# Patient Record
Sex: Female | Born: 1970 | Race: Black or African American | Hispanic: No | Marital: Married | Smoking: Never smoker
Health system: Southern US, Community
[De-identification: ages and names within clinical notes are randomized; demographics above are authoritative.]

## PROBLEM LIST (undated history)

## (undated) DIAGNOSIS — J45909 Unspecified asthma, uncomplicated: Secondary | ICD-10-CM

## (undated) DIAGNOSIS — J40 Bronchitis, not specified as acute or chronic: Secondary | ICD-10-CM

## (undated) HISTORY — PX: GANGLION CYST EXCISION: SHX1691

## (undated) HISTORY — PX: BREAST REDUCTION SURGERY: SHX8

---

## 2012-09-19 ENCOUNTER — Encounter (HOSPITAL_BASED_OUTPATIENT_CLINIC_OR_DEPARTMENT_OTHER): Payer: Self-pay | Admitting: Emergency Medicine

## 2012-09-19 ENCOUNTER — Emergency Department (HOSPITAL_BASED_OUTPATIENT_CLINIC_OR_DEPARTMENT_OTHER)
Admission: EM | Admit: 2012-09-19 | Discharge: 2012-09-19 | Disposition: A | Discharge status: Home / Self Care | Payer: Self-pay | Attending: Emergency Medicine | Admitting: Emergency Medicine

## 2012-09-19 DIAGNOSIS — Y929 Unspecified place or not applicable: Secondary | ICD-10-CM | POA: Insufficient documentation

## 2012-09-19 DIAGNOSIS — X58XXXA Exposure to other specified factors, initial encounter: Secondary | ICD-10-CM | POA: Insufficient documentation

## 2012-09-19 DIAGNOSIS — S5412XA Injury of median nerve at forearm level, left arm, initial encounter: Secondary | ICD-10-CM

## 2012-09-19 DIAGNOSIS — M542 Cervicalgia: Secondary | ICD-10-CM | POA: Insufficient documentation

## 2012-09-19 DIAGNOSIS — Z880 Allergy status to penicillin: Secondary | ICD-10-CM | POA: Insufficient documentation

## 2012-09-19 DIAGNOSIS — J45909 Unspecified asthma, uncomplicated: Secondary | ICD-10-CM | POA: Insufficient documentation

## 2012-09-19 DIAGNOSIS — Y939 Activity, unspecified: Secondary | ICD-10-CM | POA: Insufficient documentation

## 2012-09-19 DIAGNOSIS — S5410XA Injury of median nerve at forearm level, unspecified arm, initial encounter: Secondary | ICD-10-CM | POA: Insufficient documentation

## 2012-09-19 HISTORY — DX: Bronchitis, not specified as acute or chronic: J40

## 2012-09-19 HISTORY — DX: Unspecified asthma, uncomplicated: J45.909

## 2012-09-19 HISTORY — DX: Morbid (severe) obesity due to excess calories: E66.01

## 2012-09-19 LAB — CBC WITH DIFFERENTIAL/PLATELET
Basophils Relative: 0 % (ref 0–1)
Eosinophils Absolute: 0.2 10*3/uL (ref 0.0–0.7)
Lymphs Abs: 4.3 10*3/uL — ABNORMAL HIGH (ref 0.7–4.0)
MCH: 27.4 pg (ref 26.0–34.0)
Neutro Abs: 7.4 10*3/uL (ref 1.7–7.7)
Neutrophils Relative %: 58 % (ref 43–77)
Platelets: 332 10*3/uL (ref 150–400)
RBC: 4.13 MIL/uL (ref 3.87–5.11)

## 2012-09-19 LAB — COMPREHENSIVE METABOLIC PANEL
ALT: 18 U/L (ref 0–35)
Albumin: 3.6 g/dL (ref 3.5–5.2)
Alkaline Phosphatase: 66 U/L (ref 39–117)
Glucose, Bld: 104 mg/dL — ABNORMAL HIGH (ref 70–99)
Potassium: 3.7 mEq/L (ref 3.5–5.1)
Sodium: 144 mEq/L (ref 135–145)
Total Protein: 7.6 g/dL (ref 6.0–8.3)

## 2012-09-19 MED ORDER — IBUPROFEN 600 MG PO TABS: 600.0000 mg | ORAL_TABLET | Freq: Four times a day (QID) | ORAL | Status: DC | PRN

## 2012-09-19 NOTE — ED Notes (Signed)
Pt c/o left arm numbness radiating to neck intermittent x 5 days.

## 2012-09-19 NOTE — ED Provider Notes (Signed)
History    CSN: [id number] Arrival date & time 09/19/12  5284  First MD Initiated Contact with Patient 09/19/12 2054     Chief Complaint  Patient presents with  . Numbness   (Consider location/radiation/quality/duration/timing/severity/associated sxs/prior Treatment) HPI Pt states she woke 5 days ago with numbness and pin and needles sensation in L hand radiating up into arm. Numbness confined to 1st-3rd digits and palmar surface of hand. No weakness. No similar symptoms. No head. C/o neck pain but denies trauma or past surgery. No fever chills.  Past Medical History  Diagnosis Date  . Morbid obesity   . Asthma   . Bronchitis    Past Surgical History  Procedure Laterality Date  . Breast reduction surgery    . Ganglion cyst excision     No family history on file. History  Substance Use Topics  . Smoking status: Never Smoker   . Smokeless tobacco: Not on file  . Alcohol Use: No   OB History   Grav Para Term Preterm Abortions TAB SAB Ect Mult Living                 Review of Systems  Constitutional: Negative for fever and chills.  HENT: Positive for neck pain. Negative for neck stiffness.   Respiratory: Negative for shortness of breath.   Cardiovascular: Negative for chest pain.  Gastrointestinal: Negative for nausea, vomiting and abdominal pain.  Musculoskeletal: Negative for myalgias and back pain.  Skin: Negative for rash and wound.  Neurological: Positive for numbness. Negative for dizziness, syncope, weakness and light-headedness.  All other systems reviewed and are negative.    Allergies  Penicillins  Home Medications   Current Outpatient Rx  Name  Route  Sig  Dispense  Refill  . ibuprofen (ADVIL,MOTRIN) 600 MG tablet   Oral   Take 1 tablet (600 mg total) by mouth every 6 (six) hours as needed for pain.   30 tablet   0    BP 131/90  Pulse 86  Temp(Src) 98.2 F (36.8 C) (Oral)  Resp 18  Ht 5\' 2"  (1.575 m)  Wt 260 lb (117.935 kg)  BMI 47.54  kg/m2  SpO2 99% Physical Exam  Nursing note and vitals reviewed. Constitutional: She is oriented to person, place, and time. She appears well-developed and well-nourished. No distress.  HENT:  Head: Normocephalic and atraumatic.  Mouth/Throat: Oropharynx is clear and moist.  Eyes: EOM are normal. Pupils are equal, round, and reactive to light.  Neck: Normal range of motion. Neck supple.  Mild l cervical paraspinal TTP. No midline tenderness  Cardiovascular: Normal rate and regular rhythm.   Pulmonary/Chest: Effort normal and breath sounds normal. No respiratory distress. She has no wheezes. She has no rales.  Abdominal: Soft. Bowel sounds are normal. She exhibits no distension and no mass. There is no tenderness. There is no rebound and no guarding.  Musculoskeletal: Normal range of motion. She exhibits no edema and no tenderness.  FROM in all joints. +Tinel in L wrist.   Neurological: She is alert and oriented to person, place, and time.  5/5 motor in all ext including intrinsic muscles of hand. Decreased sensation in median distribution of L hand. No other sensory deficits.   Skin: Skin is warm and dry. No rash noted. No erythema.  Psychiatric: She has a normal mood and affect. Her behavior is normal.    ED Course  Procedures (including critical care time) Labs Reviewed  CBC WITH DIFFERENTIAL - Abnormal; Notable for  the following:    WBC 12.8 (*)    Hemoglobin 11.3 (*)    HCT 34.2 (*)    Lymphs Abs 4.3 (*)    All other components within normal limits  COMPREHENSIVE METABOLIC PANEL - Abnormal; Notable for the following:    Glucose, Bld 104 (*)    GFR calc non Af Amer 90 (*)    All other components within normal limits   No results found. 1. Neuropraxia of left median nerve     MDM  Normal electrolytes. elevated WBC of unclear significance. Pt with neuropraxia vs carpal tunnel. Splinted and given hand f/u. Return precautions given.   Loren Racer, MD 09/19/12 [phone]

## 2015-01-12 ENCOUNTER — Encounter (HOSPITAL_BASED_OUTPATIENT_CLINIC_OR_DEPARTMENT_OTHER): Payer: Self-pay | Admitting: Emergency Medicine

## 2015-01-12 ENCOUNTER — Emergency Department (HOSPITAL_BASED_OUTPATIENT_CLINIC_OR_DEPARTMENT_OTHER): Payer: Self-pay

## 2015-01-12 ENCOUNTER — Emergency Department (HOSPITAL_BASED_OUTPATIENT_CLINIC_OR_DEPARTMENT_OTHER)
Admission: EM | Admit: 2015-01-12 | Discharge: 2015-01-12 | Disposition: A | Discharge status: Home / Self Care | Payer: Self-pay | Attending: Emergency Medicine | Admitting: Emergency Medicine

## 2015-01-12 DIAGNOSIS — J40 Bronchitis, not specified as acute or chronic: Secondary | ICD-10-CM

## 2015-01-12 DIAGNOSIS — J45909 Unspecified asthma, uncomplicated: Secondary | ICD-10-CM | POA: Insufficient documentation

## 2015-01-12 DIAGNOSIS — Z880 Allergy status to penicillin: Secondary | ICD-10-CM | POA: Insufficient documentation

## 2015-01-12 MED ORDER — GUAIFENESIN-CODEINE 100-10 MG/5ML PO SOLN
5.0000 mL | Freq: Once | ORAL | Status: AC
  Administered 2015-01-12: 5 mL via ORAL
  Filled 2015-01-12: qty 5

## 2015-01-12 MED ORDER — AEROCHAMBER PLUS FLO-VU MEDIUM MISC
1.0000 | Freq: Once | Status: AC
  Administered 2015-01-12: 1
  Filled 2015-01-12: qty 1

## 2015-01-12 MED ORDER — IPRATROPIUM-ALBUTEROL 0.5-2.5 (3) MG/3ML IN SOLN
3.0000 mL | Freq: Once | RESPIRATORY_TRACT | Status: AC
  Administered 2015-01-12: 3 mL via RESPIRATORY_TRACT
  Filled 2015-01-12: qty 3

## 2015-01-12 MED ORDER — ACETAMINOPHEN-CODEINE 120-12 MG/5ML PO SOLN: 10.0000 mL | ORAL | Status: DC | PRN

## 2015-01-12 MED ORDER — ALBUTEROL SULFATE HFA 108 (90 BASE) MCG/ACT IN AERS
2.0000 | INHALATION_SPRAY | RESPIRATORY_TRACT | Status: DC | PRN
  Administered 2015-01-12: 2 via RESPIRATORY_TRACT
  Filled 2015-01-12: qty 6.7

## 2015-01-12 MED ORDER — PREDNISONE 50 MG PO TABS: 50.0000 mg | ORAL_TABLET | Freq: Every day | ORAL | Status: DC

## 2015-01-12 NOTE — ED Provider Notes (Signed)
CSN: [id number]     Arrival date & time 01/12/15  1906 History   First MD Initiated Contact with Patient 01/12/15 2041     Chief Complaint  Patient presents with  . Cough     (Consider location/radiation/quality/duration/timing/severity/associated sxs/prior Treatment) HPI Patient presents to the emergency department with cough that started 2 months ago, but got worse over the last week.  The patient states that nothing seems make her condition better or worse.  Patient states that she has had to use inhalers in the past due to asthma.  She states that she did not take any medications prior to arrival.  States he has not had any chest pain, nausea, vomiting, weakness, dizziness, headache, blurred vision, fever, runny nose, sore throat, back pain, neck pain, abdominal pain, dysuria, or syncope.  The patient states that she has not used an inhaler in quite a while Past Medical History  Diagnosis Date  . Morbid obesity ([place])   . Asthma   . Bronchitis    Past Surgical History  Procedure Laterality Date  . Breast reduction surgery    . Ganglion cyst excision     History reviewed. No pertinent family history. Social History  Substance Use Topics  . Smoking status: Never Smoker   . Smokeless tobacco: None  . Alcohol Use: No   OB History    No data available     Review of Systems   All other systems negative except as documented in the HPI. All pertinent positives and negatives as reviewed in the HPI. Allergies  Penicillins  Home Medications   Prior to Admission medications   Medication Sig Start Date End Date Taking? Authorizing Provider  acetaminophen-codeine 120-12 MG/5ML solution Take 10 mLs by mouth every 4 (four) hours as needed for moderate pain. 01/12/15   Dalia Heading, PA-C  ibuprofen (ADVIL,MOTRIN) 600 MG tablet Take 1 tablet (600 mg total) by mouth every 6 (six) hours as needed for pain. 09/19/12   Julianne Rice, MD  predniSONE (DELTASONE) 50 MG tablet Take 1  tablet (50 mg total) by mouth daily. 01/12/15   Finneus Kaneshiro, PA-C   BP 136/80 mmHg  Pulse 87  Temp(Src) 98.3 F (36.8 C) (Oral)  Resp 20  Ht 5\' 2"  (1.575 m)  Wt 260 lb (117.935 kg)  BMI 47.54 kg/m2  SpO2 100%  LMP 01/05/2015 Physical Exam  Constitutional: She is oriented to person, place, and time. She appears well-developed and well-nourished. No distress.  HENT:  Head: Normocephalic and atraumatic.  Mouth/Throat: Oropharynx is clear and moist.  Eyes: Pupils are equal, round, and reactive to light.  Neck: Normal range of motion. Neck supple.  Cardiovascular: Normal rate, regular rhythm and normal heart sounds.  Exam reveals no gallop and no friction rub.   No murmur heard. Pulmonary/Chest: Effort normal and breath sounds normal. No respiratory distress. She has no wheezes.  Musculoskeletal: She exhibits no edema.  Neurological: She is alert and oriented to person, place, and time. She exhibits normal muscle tone. Coordination normal.  Skin: Skin is warm and dry. No rash noted. No erythema.  Psychiatric: She has a normal mood and affect. Her behavior is normal.  Nursing note and vitals reviewed.   ED Course  Procedures (including critical care time) Labs Review Labs Reviewed - No data to display  Imaging Review Dg Chest 2 View  01/12/2015  CLINICAL DATA:  Initial evaluation for acute cough for 2 months. EXAM: CHEST  2 VIEW COMPARISON:  None. FINDINGS: Mild cardiomegaly  with probable left atrial enlargement as seen as a double curvilinear density along the right heart border on frontal projection. Mediastinal silhouette within normal limits. The lungs are normally inflated. No airspace consolidation, pleural effusion, or pulmonary edema is identified. There is no pneumothorax. No acute osseous abnormality identified. Mild degenerate spurring within the visualized spine. IMPRESSION: 1. No active cardiopulmonary disease. 2. Mild cardiomegaly. Electronically Signed   By:  Jeannine Boga M.D.   On: 01/12/2015 20:03   I have personally reviewed and evaluated these images and lab results as part of my medical decision-making.   Patient be treated for bronchitis.  Told to return here as needed.  Patient agrees the plan and all questions were answered.  X-rays did not show any signs of pneumonia   Dalia Heading, PA-C 30/94/07 [id number]  David Glick, MD 81/10/31 5945

## 2015-01-12 NOTE — ED Notes (Signed)
PA student at bedside.

## 2015-01-12 NOTE — Discharge Instructions (Signed)
Return here as needed.  Follow up with a primary care doctor °

## 2015-01-12 NOTE — ED Notes (Signed)
Patient states that she is unable to stop coughing. The patient reports that she is having coughing worse at night and it has been intermittent x 2 months

## 2018-08-31 ENCOUNTER — Emergency Department (HOSPITAL_BASED_OUTPATIENT_CLINIC_OR_DEPARTMENT_OTHER): Payer: BLUE CROSS/BLUE SHIELD

## 2018-08-31 ENCOUNTER — Encounter (HOSPITAL_BASED_OUTPATIENT_CLINIC_OR_DEPARTMENT_OTHER): Payer: Self-pay | Admitting: *Deleted

## 2018-08-31 ENCOUNTER — Other Ambulatory Visit: Payer: Self-pay

## 2018-08-31 ENCOUNTER — Emergency Department (HOSPITAL_BASED_OUTPATIENT_CLINIC_OR_DEPARTMENT_OTHER)
Admission: EM | Admit: 2018-08-31 | Discharge: 2018-08-31 | Disposition: A | Discharge status: Home / Self Care | Payer: BLUE CROSS/BLUE SHIELD | Attending: Emergency Medicine | Admitting: Emergency Medicine

## 2018-08-31 DIAGNOSIS — Y929 Unspecified place or not applicable: Secondary | ICD-10-CM | POA: Diagnosis not present

## 2018-08-31 DIAGNOSIS — J45909 Unspecified asthma, uncomplicated: Secondary | ICD-10-CM | POA: Insufficient documentation

## 2018-08-31 DIAGNOSIS — W1842XA Slipping, tripping and stumbling without falling due to stepping into hole or opening, initial encounter: Secondary | ICD-10-CM | POA: Diagnosis not present

## 2018-08-31 DIAGNOSIS — Y998 Other external cause status: Secondary | ICD-10-CM | POA: Insufficient documentation

## 2018-08-31 DIAGNOSIS — Y9389 Activity, other specified: Secondary | ICD-10-CM | POA: Diagnosis not present

## 2018-08-31 DIAGNOSIS — S93402A Sprain of unspecified ligament of left ankle, initial encounter: Secondary | ICD-10-CM | POA: Insufficient documentation

## 2018-08-31 DIAGNOSIS — S99912A Unspecified injury of left ankle, initial encounter: Secondary | ICD-10-CM | POA: Diagnosis present

## 2018-08-31 NOTE — ED Triage Notes (Signed)
Pt seen by PMD today for dx ankle sprain, Naproxen prescription not filled, Post op boot on

## 2018-08-31 NOTE — ED Provider Notes (Signed)
Emergency Department Provider Note   I have reviewed the triage vital signs and the nursing notes.   HISTORY  Chief Complaint Ankle Injury   HPI Christine Spencer is a 48 y.o. female with PMH of asthma presents to the emergency department for evaluation of left ankle pain.  Patient states that she was stepping off a curb and did not see that there was a pothole there.  Her ankle rolled she felt severe pain in the ankle.  She went to work and experiencing swelling and was unable to bear weight.  She went to see her PCP who applied a boot and scheduled an x-ray for Monday.  She was discharged home with a prescription for naproxen.  She came to the emergency department with continued pain.  Denies any numbness.  She does have some radiation of pain into the foot and lower leg.    Past Medical History:  Diagnosis Date  . Asthma   . Bronchitis   . Morbid obesity ([place])     There are no active problems to display for this patient.   Past Surgical History:  Procedure Laterality Date  . BREAST REDUCTION SURGERY    . GANGLION CYST EXCISION      Allergies Penicillins  History reviewed. No pertinent family history.  Social History Social History   Tobacco Use  . Smoking status: Never Smoker  . Smokeless tobacco: Never Used  Substance Use Topics  . Alcohol use: No  . Drug use: No    Review of Systems  Constitutional: No fever/chills Genitourinary: Negative for dysuria. Musculoskeletal: Positive left ankle pain.  Skin: Negative for rash. Neurological: Negative for headaches, focal weakness or numbness.  10-point ROS otherwise negative.  ____________________________________________   PHYSICAL EXAM:  VITAL SIGNS: ED Triage Vitals  Enc Vitals Group     BP 08/31/18 1842 (!) 158/97     Pulse Rate 08/31/18 1842 71     Resp 08/31/18 1842 18     Temp 08/31/18 1842 98.8 F (37.1 C)     Temp Source 08/31/18 1842 Oral     SpO2 08/31/18 1842 100 %     Weight 08/31/18  1843 260 lb (117.9 kg)     Height 08/31/18 1843 5\' 2"  (1.575 m)     Pain Score 08/31/18 1842 10   Constitutional: Alert and oriented. Well appearing and in no acute distress. Eyes: Conjunctivae are normal.  Head: Atraumatic. Nose: No congestion/rhinnorhea. Mouth/Throat: Mucous membranes are moist.  Neck: No stridor.  Cardiovascular: Good peripheral circulation.  Respiratory: Normal respiratory effort.  Gastrointestinal: No distention.  Musculoskeletal: Left lateral ankle tenderness with focal swelling. No tenderness over the foot. No proximal fibular tenderness.  Neurologic:  Normal speech and language. No gross focal neurologic deficits are appreciated.  Skin:  Skin is warm, dry and intact. No rash noted.  ____________________________________________  RADIOLOGY  Ankle x-ray reviewed.  ____________________________________________   PROCEDURES  Procedure(s) performed:   Procedures  None ____________________________________________   INITIAL IMPRESSION / ASSESSMENT AND PLAN / ED COURSE  Pertinent labs & imaging results that were available during my care of the patient were reviewed by me and considered in my medical decision making (see chart for details).   Patient presents to the emergency department for evaluation of left ankle pain after injury.  Suspect sprain clinically.  Will obtain x-ray to rule out fracture.  Not clinically dislocated.  No focal tenderness in the foot.  No proximal fibular tenderness.   Plain film reviewed. No  fracture. Provided crutches and advised RICE at home and sport med/PCP follow up if symptoms persist.  ____________________________________________  FINAL CLINICAL IMPRESSION(S) / ED DIAGNOSES  Final diagnoses:  Sprain of left ankle, unspecified ligament, initial encounter    Note:  This document was prepared using Dragon voice recognition software and may include unintentional dictation errors.  Nanda Quinton, MD Emergency Medicine     , Wonda Olds, MD 09/03/18 [phone]

## 2018-08-31 NOTE — ED Notes (Signed)
ED Provider at bedside. 

## 2018-08-31 NOTE — Discharge Instructions (Signed)
As we discussed, you do not have any broken or dislocated bones in your foot or ankle, but you do have an ankle sprain. There is always a chance that a small fracture did not appear on today's x-ray. Please read through the included information about routine injury care (RICE = rest, ice, compression, elevation), and take over-the-counter pain medicine according to label instructions.  If you do not have any reason to avoid ibuprofen, you can also consider taking ibuprofen 600 mg 3 times a day with meals, but do this for no more than 5 days as it may cause to some stomach discomfort over time.  Use crutches if provided and you may bear weight as tolerated.  Follow-up is recommended with the orthopedic surgeon or with your regular doctor. ° ° °Ankle Sprain °An ankle sprain is an injury to the strong, fibrous tissues (ligaments) that hold the bones of your ankle joint together.  °CAUSES °An ankle sprain is usually caused by a fall or by twisting your ankle. Ankle sprains most commonly occur when you step on the outer edge of your foot, and your ankle turns inward. People who participate in sports are more prone to these types of injuries.  °SYMPTOMS  °Pain in your ankle. The pain may be present at rest or only when you are trying to stand or walk. °Swelling. °Bruising. Bruising may develop immediately or within 1 to 2 days after your injury. °Difficulty standing or walking, particularly when turning corners or changing directions. °DIAGNOSIS  °Your caregiver will ask you details about your injury and perform a physical exam of your ankle to determine if you have an ankle sprain. During the physical exam, your caregiver will press on and apply pressure to specific areas of your foot and ankle. Your caregiver will try to move your ankle in certain ways. An X-ray exam may be done to be sure a bone was not broken or a ligament did not separate from one of the bones in your ankle (avulsion fracture).  °TREATMENT  °Certain  types of braces can help stabilize your ankle. Your caregiver can make a recommendation for this. Your caregiver may recommend the use of medicine for pain. If your sprain is severe, your caregiver may refer you to a surgeon who helps to restore function to parts of your skeletal system (orthopedist) or a physical therapist. °HOME CARE INSTRUCTIONS  °Apply ice to your injury for 1-2 days or as directed by your caregiver. Applying ice helps to reduce inflammation and pain. °Put ice in a plastic bag. °Place a towel between your skin and the bag. °Leave the ice on for 15-20 minutes at a time, every 2 hours while you are awake. °Only take over-the-counter or prescription medicines for pain, discomfort, or fever as directed by your caregiver. °Elevate your injured ankle above the level of your heart as much as possible for 2-3 days. °If your caregiver recommends crutches, use them as instructed. Gradually put weight on the affected ankle. Continue to use crutches or a cane until you can walk without feeling pain in your ankle. °If you have a plaster splint, wear the splint as directed by your caregiver. Do not rest it on anything harder than a pillow for the first 24 hours. Do not put weight on it. Do not get it wet. You may take it off to take a shower or bath. °You may have been given an elastic bandage to wear around your ankle to provide support. If the elastic bandage   is too tight (you have numbness or tingling in your foot or your foot becomes cold and blue), adjust the bandage to make it comfortable. °If you have an air splint, you may blow more air into it or let air out to make it more comfortable. You may take your splint off at night and before taking a shower or bath. Wiggle your toes in the splint several times per day to decrease swelling. °SEEK MEDICAL CARE IF:  °You have rapidly increasing bruising or swelling. °Your toes feel extremely cold or you lose feeling in your foot. °Your pain is not relieved  with medicine. °SEEK IMMEDIATE MEDICAL CARE IF: °Your toes are numb or blue. °You have severe pain that is increasing. °MAKE SURE YOU:  °Understand these instructions. °Will watch your condition. °Will get help right away if you are not doing well or get worse. °  °This information is not intended to replace advice given to you by your health care provider. Make sure you discuss any questions you have with your health care provider. °  °Document Released: 03/14/2005 Document Revised: 04/04/2014 Document Reviewed: 03/26/2011 °Elsevier Interactive Patient Education ©2016 [employer] ° °Elastic Bandage and RICE °WHAT DOES AN ELASTIC BANDAGE DO? °Elastic bandages come in different shapes and sizes. They generally provide support to your injury and reduce swelling while you are healing, but they can perform different functions. Your health care provider will help you to decide what is best for your protection, recovery, or rehabilitation following an injury. °WHAT ARE SOME GENERAL TIPS FOR USING AN ELASTIC BANDAGE? °Use the bandage as directed by the maker of the bandage that you are using. °Do not wrap the bandage too tightly. This may cut off the circulation in the arm or leg in the area below the bandage. °If part of your body beyond the bandage becomes blue, numb, cold, swollen, or is more painful, your bandage is most likely too tight. If this occurs, remove your bandage and reapply it more loosely. °See your health care provider if the bandage seems to be making your problems worse rather than better. °An elastic bandage should be removed and reapplied every 3-4 hours or as directed by your health care provider. °WHAT IS RICE? °The routine care of many injuries includes rest, ice, compression, and elevation (RICE therapy).  °Rest °Rest is required to allow your body to heal. Generally, you can resume your routine activities when you are comfortable and have been given permission by your health care  provider. °Ice °Icing your injury helps to keep the swelling down and it reduces pain. Do not apply ice directly to your skin. °Put ice in a plastic bag. °Place a towel between your skin and the bag. °Leave the ice on for 20 minutes, 2-3 times per day. °Do this for as Shalom Ware as you are directed by your health care provider. °Compression °Compression helps to keep swelling down, gives support, and helps with discomfort. Compression may be done with an elastic bandage. °Elevation °Elevation helps to reduce swelling and it decreases pain. If possible, your injured area should be placed at or above the level of your heart or the center of your chest. °WHEN SHOULD I SEEK MEDICAL CARE? °You should seek medical care if: °You have persistent pain and swelling. °Your symptoms are getting worse rather than improving. °These symptoms may indicate that further evaluation or further X-rays are needed. Sometimes, X-rays may not show a small broken bone (fracture) until a number of days later. Make   a follow-up appointment with your health care provider. Ask when your X-ray results will be ready. Make sure that you get your X-ray results. °WHEN SHOULD I SEEK IMMEDIATE MEDICAL CARE? °You should seek immediate medical care if: °You have a sudden onset of severe pain at or below the area of your injury. °You develop redness or increased swelling around your injury. °You have tingling or numbness at or below the area of your injury that does not improve after you remove the elastic bandage. °  °This information is not intended to replace advice given to you by your health care provider. Make sure you discuss any questions you have with your health care provider. °  °Document Released: 09/03/2001 Document Revised: 12/03/2014 Document Reviewed: 10/28/2013 °Elsevier Interactive Patient Education ©2016 [employer] ° °  °

## 2018-09-19 ENCOUNTER — Ambulatory Visit: Payer: BLUE CROSS/BLUE SHIELD | Admitting: Family Medicine

## 2019-05-01 ENCOUNTER — Other Ambulatory Visit: Payer: Self-pay

## 2019-05-01 ENCOUNTER — Inpatient Hospital Stay (HOSPITAL_COMMUNITY)
Admission: EM | Admit: 2019-05-01 | Discharge: 2019-05-04 | DRG: 811 | Disposition: A | Discharge status: Home / Self Care | Payer: BLUE CROSS/BLUE SHIELD | Attending: Family Medicine | Admitting: Family Medicine

## 2019-05-01 ENCOUNTER — Emergency Department (HOSPITAL_COMMUNITY): Payer: BLUE CROSS/BLUE SHIELD

## 2019-05-01 ENCOUNTER — Encounter (HOSPITAL_COMMUNITY): Payer: Self-pay | Admitting: Family Medicine

## 2019-05-01 DIAGNOSIS — D473 Essential (hemorrhagic) thrombocythemia: Secondary | ICD-10-CM | POA: Diagnosis present

## 2019-05-01 DIAGNOSIS — R0602 Shortness of breath: Secondary | ICD-10-CM | POA: Diagnosis present

## 2019-05-01 DIAGNOSIS — Z880 Allergy status to penicillin: Secondary | ICD-10-CM

## 2019-05-01 DIAGNOSIS — E876 Hypokalemia: Secondary | ICD-10-CM | POA: Diagnosis present

## 2019-05-01 DIAGNOSIS — D649 Anemia, unspecified: Secondary | ICD-10-CM

## 2019-05-01 DIAGNOSIS — I82441 Acute embolism and thrombosis of right tibial vein: Secondary | ICD-10-CM | POA: Diagnosis present

## 2019-05-01 DIAGNOSIS — J4520 Mild intermittent asthma, uncomplicated: Secondary | ICD-10-CM | POA: Diagnosis present

## 2019-05-01 DIAGNOSIS — N920 Excessive and frequent menstruation with regular cycle: Secondary | ICD-10-CM | POA: Diagnosis present

## 2019-05-01 DIAGNOSIS — I82452 Acute embolism and thrombosis of left peroneal vein: Secondary | ICD-10-CM | POA: Diagnosis present

## 2019-05-01 DIAGNOSIS — U071 COVID-19: Secondary | ICD-10-CM | POA: Diagnosis present

## 2019-05-01 DIAGNOSIS — Z6841 Body Mass Index (BMI) 40.0 and over, adult: Secondary | ICD-10-CM | POA: Diagnosis not present

## 2019-05-01 DIAGNOSIS — D62 Acute posthemorrhagic anemia: Principal | ICD-10-CM | POA: Diagnosis present

## 2019-05-01 DIAGNOSIS — R7989 Other specified abnormal findings of blood chemistry: Secondary | ICD-10-CM | POA: Diagnosis not present

## 2019-05-01 DIAGNOSIS — J45909 Unspecified asthma, uncomplicated: Secondary | ICD-10-CM | POA: Diagnosis present

## 2019-05-01 LAB — COMPREHENSIVE METABOLIC PANEL
ALT: 15 U/L (ref 0–44)
AST: 21 U/L (ref 15–41)
Albumin: 3.8 g/dL (ref 3.5–5.0)
Alkaline Phosphatase: 44 U/L (ref 38–126)
Anion gap: 11 (ref 5–15)
BUN: 12 mg/dL (ref 6–20)
CO2: 25 mmol/L (ref 22–32)
Calcium: 10.6 mg/dL — ABNORMAL HIGH (ref 8.9–10.3)
Chloride: 106 mmol/L (ref 98–111)
Creatinine, Ser: 0.77 mg/dL (ref 0.44–1.00)
GFR calc Af Amer: >60 mL/min (ref >60)
GFR calc non Af Amer: >60 mL/min (ref >60)
Glucose, Bld: 112 mg/dL — ABNORMAL HIGH (ref 70–99)
Potassium: 3.1 mmol/L — ABNORMAL LOW (ref 3.5–5.1)
Sodium: 142 mmol/L (ref 135–145)
Total Bilirubin: 1.3 mg/dL — ABNORMAL HIGH (ref 0.3–1.2)
Total Protein: 8.4 g/dL — ABNORMAL HIGH (ref 6.5–8.1)

## 2019-05-01 LAB — CBC WITH DIFFERENTIAL/PLATELET
Abs Immature Granulocytes: 0.04 10*3/uL (ref 0.00–0.07)
Basophils Absolute: 0.1 10*3/uL (ref 0.0–0.1)
Basophils Relative: 1 %
Eosinophils Absolute: 0.1 10*3/uL (ref 0.0–0.5)
Eosinophils Relative: 1 %
HCT: 28.2 % — ABNORMAL LOW (ref 36.0–46.0)
Hemoglobin: 7.5 g/dL — ABNORMAL LOW (ref 12.0–15.0)
Immature Granulocytes: 0 %
Lymphocytes Relative: 25 %
Lymphs Abs: 2.6 10*3/uL (ref 0.7–4.0)
MCH: 18.2 pg — ABNORMAL LOW (ref 26.0–34.0)
MCHC: 26.6 g/dL — ABNORMAL LOW (ref 30.0–36.0)
MCV: 68.3 fL — ABNORMAL LOW (ref 80.0–100.0)
Monocytes Absolute: 1 10*3/uL (ref 0.1–1.0)
Monocytes Relative: 9 %
Neutro Abs: 6.7 10*3/uL (ref 1.7–7.7)
Neutrophils Relative %: 64 %
Platelets: 602 10*3/uL — ABNORMAL HIGH (ref 150–400)
RBC: 4.13 MIL/uL (ref 3.87–5.11)
RDW: 20.6 % — ABNORMAL HIGH (ref 11.5–15.5)
WBC: 10.5 10*3/uL (ref 4.0–10.5)
nRBC: 0 % (ref 0.0–0.2)

## 2019-05-01 LAB — PREPARE RBC (CROSSMATCH)

## 2019-05-01 LAB — I-STAT BETA HCG BLOOD, ED (MC, WL, AP ONLY): I-stat hCG, quantitative: <5 m[IU]/mL (ref <5)

## 2019-05-01 LAB — RETICULOCYTES
Immature Retic Fract: 28.1 % — ABNORMAL HIGH (ref 2.3–15.9)
RBC.: 3.8 MIL/uL — ABNORMAL LOW (ref 3.87–5.11)
Retic Count, Absolute: 80.2 10*3/uL (ref 19.0–186.0)
Retic Ct Pct: 2.1 % (ref 0.4–3.1)

## 2019-05-01 LAB — D-DIMER, QUANTITATIVE: D-Dimer, Quant: 2.55 ug/mL-FEU — ABNORMAL HIGH (ref 0.00–0.50)

## 2019-05-01 LAB — IRON AND TIBC
Iron: 18 ug/dL — ABNORMAL LOW (ref 28–170)
Saturation Ratios: 5 % — ABNORMAL LOW (ref 10.4–31.8)
TIBC: 363 ug/dL (ref 250–450)
UIBC: 345 ug/dL

## 2019-05-01 LAB — PROTIME-INR
INR: 1.1 (ref 0.8–1.2)
Prothrombin Time: 13.9 seconds (ref 11.4–15.2)

## 2019-05-01 LAB — TRIGLYCERIDES: Triglycerides: 105 mg/dL (ref <150)

## 2019-05-01 LAB — ABO/RH: ABO/RH(D): O POS

## 2019-05-01 LAB — MAGNESIUM: Magnesium: 2.5 mg/dL — ABNORMAL HIGH (ref 1.7–2.4)

## 2019-05-01 LAB — BRAIN NATRIURETIC PEPTIDE: B Natriuretic Peptide: 31.4 pg/mL (ref 0.0–100.0)

## 2019-05-01 LAB — C-REACTIVE PROTEIN: CRP: 1.1 mg/dL — ABNORMAL HIGH (ref <1.0)

## 2019-05-01 LAB — FIBRINOGEN: Fibrinogen: 508 mg/dL — ABNORMAL HIGH (ref 210–475)

## 2019-05-01 LAB — LACTIC ACID, PLASMA
Lactic Acid, Venous: 1.6 mmol/L (ref 0.5–1.9)
Lactic Acid, Venous: 1.3 mmol/L (ref 0.5–1.9)

## 2019-05-01 LAB — POC OCCULT BLOOD, ED: Fecal Occult Bld: NEGATIVE

## 2019-05-01 LAB — TROPONIN I (HIGH SENSITIVITY)
Troponin I (High Sensitivity): <2 ng/L (ref <18)
Troponin I (High Sensitivity): <2 ng/L (ref <18)

## 2019-05-01 LAB — FERRITIN: Ferritin: 8 ng/mL — ABNORMAL LOW (ref 11–307)

## 2019-05-01 LAB — LACTATE DEHYDROGENASE: LDH: 216 U/L — ABNORMAL HIGH (ref 98–192)

## 2019-05-01 LAB — POC SARS CORONAVIRUS 2 AG -  ED: SARS Coronavirus 2 Ag: NEGATIVE

## 2019-05-01 LAB — PROCALCITONIN: Procalcitonin: <0.1 ng/mL

## 2019-05-01 MED ORDER — ACETAMINOPHEN 650 MG RE SUPP: 650.0000 mg | Freq: Four times a day (QID) | RECTAL | Status: DC | PRN

## 2019-05-01 MED ORDER — SODIUM CHLORIDE 0.9 % IV SOLN: 10.0000 mL/h | Freq: Once | INTRAVENOUS | Status: DC

## 2019-05-01 MED ORDER — ACETAMINOPHEN 325 MG PO TABS: 650.0000 mg | ORAL_TABLET | Freq: Four times a day (QID) | ORAL | Status: DC | PRN

## 2019-05-01 MED ORDER — ALBUTEROL SULFATE HFA 108 (90 BASE) MCG/ACT IN AERS
2.0000 | INHALATION_SPRAY | Freq: Once | RESPIRATORY_TRACT | Status: AC
  Administered 2019-05-01: 15:00:00 2 via RESPIRATORY_TRACT
  Filled 2019-05-01: qty 6.7

## 2019-05-01 MED ORDER — ALBUTEROL SULFATE HFA 108 (90 BASE) MCG/ACT IN AERS
1.0000 | INHALATION_SPRAY | RESPIRATORY_TRACT | Status: DC | PRN
  Filled 2019-05-01: qty 6.7

## 2019-05-01 MED ORDER — ACETAMINOPHEN 325 MG PO TABS
650.0000 mg | ORAL_TABLET | Freq: Once | ORAL | Status: AC
  Administered 2019-05-01: 15:00:00 650 mg via ORAL
  Filled 2019-05-01: qty 2

## 2019-05-01 MED ORDER — SODIUM CHLORIDE 0.9% FLUSH
3.0000 mL | Freq: Two times a day (BID) | INTRAVENOUS | Status: DC
  Administered 2019-05-02 – 2019-05-04 (×3): 3 mL via INTRAVENOUS

## 2019-05-01 MED ORDER — POTASSIUM CHLORIDE CRYS ER 20 MEQ PO TBCR
40.0000 meq | EXTENDED_RELEASE_TABLET | Freq: Once | ORAL | Status: AC
  Administered 2019-05-01: 40 meq via ORAL
  Filled 2019-05-01: qty 2

## 2019-05-01 NOTE — ED Provider Notes (Signed)
[facility] DEPT Provider Note   CSN: [id number] Arrival date & time: 05/01/19  1418     History Chief Complaint  Patient presents with  . COVID Positive  . SHOB    Christine Spencer is a 49 y.o. female.  HPI      49 year old female presents with shortness of breath.  Patient states that she was diagnosed with COVID-19 approximately 2 weeks ago.  She states that she has had worsening shortness of breath and chest pain since then.  She states the chest pain is related to when she coughs.  She notes it is midsternal, nonradiating and sharp with coughing.  She denies any fevers, chills, nausea, vomiting, diarrhea.  Past Medical History:  Diagnosis Date  . Asthma   . Bronchitis   . Morbid obesity ([place])     There are no problems to display for this patient.   Past Surgical History:  Procedure Laterality Date  . BREAST REDUCTION SURGERY    . GANGLION CYST EXCISION       OB History   No obstetric history on file.     History reviewed. No pertinent family history.  Social History   Tobacco Use  . Smoking status: Never Smoker  . Smokeless tobacco: Never Used  Substance Use Topics  . Alcohol use: No  . Drug use: No    Home Medications Prior to Admission medications   Medication Sig Start Date End Date Taking? Authorizing Provider  acetaminophen (TYLENOL) 325 MG tablet Take 650 mg by mouth every 6 (six) hours as needed for headache.   Yes [provider]  albuterol (VENTOLIN HFA) 108 (90 Base) MCG/ACT inhaler Inhale 2 puffs into the lungs every 4 (four) hours as needed for wheezing. 04/30/16  Yes [provider]  aspirin-acetaminophen-caffeine (EXCEDRIN MIGRAINE) [phone] MG tablet Take 1 tablet by mouth every 6 (six) hours as needed for headache.   Yes [provider]  Multiple Vitamin (MULTIVITAMIN) tablet Take 1 tablet by mouth daily.   Yes [provider]  ibuprofen (ADVIL,MOTRIN) 600 MG tablet Take 1  tablet (600 mg total) by mouth every 6 (six) hours as needed for pain. Patient not taking: Reported on 05/01/2019 09/19/12   Julianne Rice, MD    Allergies    Penicillins  Review of Systems   Review of Systems  Constitutional: Negative for chills and fever.  Respiratory: Positive for cough and shortness of breath.   Cardiovascular: Positive for chest pain.  Gastrointestinal: Negative for abdominal pain, nausea and vomiting.    Physical Exam Updated Vital Signs BP 135/80   Pulse (!) 101   Temp 98.9 F (37.2 C) (Oral)   Resp 14   Ht 5\' 1"  (1.549 m)   Wt 113.4 kg   SpO2 100%   BMI 47.24 kg/m   Physical Exam Vitals and nursing note reviewed.  Constitutional:      Appearance: She is well-developed.  HENT:     Head: Normocephalic and atraumatic.  Eyes:     Conjunctiva/sclera: Conjunctivae normal.  Cardiovascular:     Rate and Rhythm: Normal rate and regular rhythm.     Heart sounds: Normal heart sounds. No murmur.  Pulmonary:     Effort: Pulmonary effort is normal. Tachypnea present. No accessory muscle usage or respiratory distress.     Breath sounds: Examination of the right-lower field reveals rales. Examination of the left-lower field reveals rales. Rales present. No wheezing.  Abdominal:     General: Bowel sounds are  normal. There is no distension.     Palpations: Abdomen is soft.     Tenderness: There is no abdominal tenderness.  Musculoskeletal:        General: No tenderness or deformity. Normal range of motion.     Cervical back: Neck supple.  Skin:    General: Skin is warm and dry.     Findings: No erythema or rash.  Neurological:     Mental Status: She is alert and oriented to person, place, and time.  Psychiatric:        Behavior: Behavior normal.     ED Results / Procedures / Treatments   Labs (all labs ordered are listed, but only abnormal results are displayed) Labs Reviewed  CBC WITH DIFFERENTIAL/PLATELET - Abnormal; Notable for the following  components:      Result Value   Hemoglobin 7.5 (*)    HCT 28.2 (*)    MCV 68.3 (*)    MCH 18.2 (*)    MCHC 26.6 (*)    RDW 20.6 (*)    Platelets 602 (*)    All other components within normal limits  COMPREHENSIVE METABOLIC PANEL - Abnormal; Notable for the following components:   Potassium 3.1 (*)    Glucose, Bld 112 (*)    Calcium 10.6 (*)    Total Protein 8.4 (*)    Total Bilirubin 1.3 (*)    All other components within normal limits  D-DIMER, QUANTITATIVE (NOT AT [facility]) - Abnormal; Notable for the following components:   D-Dimer, Quant 2.55 (*)    All other components within normal limits  LACTATE DEHYDROGENASE - Abnormal; Notable for the following components:   LDH 216 (*)    All other components within normal limits  FERRITIN - Abnormal; Notable for the following components:   Ferritin 8 (*)    All other components within normal limits  FIBRINOGEN - Abnormal; Notable for the following components:   Fibrinogen 508 (*)    All other components within normal limits  C-REACTIVE PROTEIN - Abnormal; Notable for the following components:   CRP 1.1 (*)    All other components within normal limits  SARS CORONAVIRUS 2 (TAT 6-24 HRS)  LACTIC ACID, PLASMA  LACTIC ACID, PLASMA  PROCALCITONIN  TRIGLYCERIDES  BRAIN NATRIURETIC PEPTIDE  I-STAT BETA HCG BLOOD, ED ([facility], [facility], [facility] ONLY)  POC SARS CORONAVIRUS 2 AG -  ED  POC OCCULT BLOOD, ED  TYPE AND SCREEN  ABO/RH  PREPARE RBC (CROSSMATCH)  TROPONIN I (HIGH SENSITIVITY)  TROPONIN I (HIGH SENSITIVITY)    EKG None  Radiology DG Chest Port 1 View  Result Date: 05/01/2019 CLINICAL DATA:  Chest pain, COVID-19 positive test 2 weeks ago, short of breath and cough EXAM: PORTABLE CHEST 1 VIEW COMPARISON:  04/30/2016 FINDINGS: Single frontal view of the chest demonstrates decreased lung volumes. There is accentuation of the central pulmonary vasculature and mild diffuse interstitial prominence, nonspecific. No airspace disease, effusion, or  pneumothorax. IMPRESSION: 1. Low lung volumes with crowding of the central vasculature. 2. No acute airspace disease. Electronically Signed   By: Randa Ngo M.D.   On: 05/01/2019 16:22    Procedures Procedures (including critical care time)  Medications Ordered in ED Medications  0.9 %  sodium chloride infusion (has no administration in time range)  albuterol (VENTOLIN HFA) 108 (90 Base) MCG/ACT inhaler 2 puff (2 puffs Inhalation Given 05/01/19 1529)  acetaminophen (TYLENOL) tablet 650 mg (650 mg Oral Given 05/01/19 1529)    ED Course  I have reviewed the triage vital signs and the nursing notes.  Pertinent labs & imaging results that were available during my care of the patient were reviewed by me and considered in my medical decision making (see chart for details).    MDM Rules/Calculators/A&P                      Patient presents with shortness of breath.  On my initial evaluation patient was tachypneic and unable to speak in full sentences.  She had rales in bilateral bases.  Vital signs show tachycardia in the low 100s and tachypnea, otherwise stable.  Patient afebrile.  Blood work notable for hemoglobin of 7.5.  Potassium little low at 3.1.  Markers for COVID-19 are elevated including fibrinogen, LDH, CRP and D-dimer. Her rapid COVID-19 swab was negative however will obtain repeat swab.  Chest x-ray shows no pneumonia, pneumothorax or pleural effusions.  She was given Tylenol and albuterol with some improvement in her tachypnea.  Patient does note significant vaginal bleeding with her menstruation.  She notes she just got off of her menstruation last week.  She denies any other source of bleeding.  She denies any melena.  She is Hemoccult negative.  Discussed with hospitalist who is agreeable with transfusion of unit packed red blood cells.  Hospitalist agreeable with admission.   Christine Spencer was evaluated in Emergency Department on 05/01/2019 for the symptoms described in the  history of present illness. She was evaluated in the context of the global COVID-19 pandemic, which necessitated consideration that the patient might be at risk for infection with the SARS-CoV-2 virus that causes COVID-19. Institutional protocols and algorithms that pertain to the evaluation of patients at risk for COVID-19 are in a state of rapid change based on information released by regulatory bodies including the CDC and federal and state organizations. These policies and algorithms were followed during the patient's care in the ED.    Final Clinical Impression(s) / ED Diagnoses Final diagnoses:  Shortness of breath  Symptomatic anemia    Rx / DC Orders ED Discharge Orders    None       Rachel Moulds 05/01/19 2046    Davonna Belling, MD 05/03/19 1553

## 2019-05-01 NOTE — H&P (Signed)
History and Physical    PLEASE NOTE THAT DRAGON DICT[employer] SOFTWARE WAS USED IN THE CONSTRUCTION OF THIS NOTE.   Christine Spencer [id number] DOB: March 04, 1971 DOA: 05/01/2019  PCP: Patient, No Pcp Per Patient coming from: home   I have personally briefly reviewed patient's old medical records in [place]  Chief Complaint: Shortness of breath  HPI: Christine Spencer is a 49 y.o. female with medical history significant for chronic iron deficiency anemia, menorrhagia, mild intermittent asthma, who is admitted to [facility] on 05/01/2019 with acute on chronic iron deficiency anemia after presenting from home to [facility] Emergency Department complaining of shortness of breath.   In the setting of a known COVID-19 positive exposure 2 weeks ago at her place of employment, the patient reports that she underwent COVID-19 at that time at [facility] clinic, which she reports was found to be positive.  At the time of this +19 test, the patient reports that she was initially completely asymptomatic, before subsequently developing nonproductive cough and shortness of breath approximately 10 days ago.  Then, approximately 1 week ago, the patient reports that she began to experience her baseline menorrhagia, which lasted until around 04/29/2019.  During this time of vaginal bleeding, the patient reports worsening of her shortness of breath, which has not significantly improved following completion of vaginal bleeding, prompting the patient to present to [facility] emergency department this evening for further evaluation.  The patient denies any recent subjective fever, chills, rigors, or generalized myalgias. Denies any recent headache, neck stiffness, rhinitis, rhinorrhea, sore throat, nausea, vomiting, abdominal pain, diarrhea, or rash. Denies dysuria, gross hematuria, or change in urinary urgency/frequency.  Denies any associated chest pain, palpitations, diaphoresis, or wheezing.   The  patient conveys a history of chronic iron deficiency anemia in the setting of her report of several years of menorrhagia.  In this setting, the patient reports that she was previously on oral iron supplementation, but has never previously required blood product transfusion.  Denies any recent significant worsening of her menorrhagia relative to baseline.  Denies any recent melena, hematochezia, hematemesis, or gross hematuria.  Not on any antiplatelet or anticoagulant medications at home.  The patient reports that she has not recently taken any of her prn Excedrin Migraine, which includes 250 mg of aspirin.     ED Course:  Vital signs in the ED were notable for the following: Temperature max 99.7; heart rate 94-1 05; blood pressure 111/81-130 1/86; respiratory rate 19-28; oxygen saturation 97 to 99% on room air  Labs were notable for the following: CMP notable for the following: Sodium 142, potassium 3.1, bicarbonate 25, creatinine 0.77.  BNP 31.  High-sensitivity troponin I x2 were both found to be less than 2.  Quantitative hCG was found to be less than 5.0.  CBC notable for white blood cell count of 10,500, hemoglobin 7.5, which was relative to most recent prior hemoglobin data point of 11.3 in June 2014, platelets 602.  MCV noted to be 68, MCHC 26.6, and RDW 20.6.  Hemoccult stool was found to be negative in the ED.  Procalcitonin less than 0.10.  Lactic acid 1.6.  In the setting of patient's report of recent COVID-19 test, inflammatory markers were obtained, notable for the following: LDH 216, CRP 1.1, D-dimer 2.55, and fibrinogen 508.  As there is no confirmation of patient's COVID-19 test result from 2 weeks ago available in care everywhere, and the patient is currently unable to provide a hardcopy or electronic  copy confirmation of this positive test finding, rapid Covid antigen was performed in the ED this evening evaluate negative.  Subsequently, nasopharyngeal COVID-19 PCR was obtained, with  result currently pending.  Chest x-ray showed no evidence of acute cardiopulmonary process.   While in the ED, the following were administered: Tylenol 650 mg p.o. x1, albuterol inhaler x1, transfusion of 1 unit PRBC was initiated.    Review of Systems: As per HPI otherwise 10 point review of systems negative.   Past Medical History:  Diagnosis Date  . Asthma   . Bronchitis   . Morbid obesity ([place])     Past Surgical History:  Procedure Laterality Date  . BREAST REDUCTION SURGERY    . GANGLION CYST EXCISION      Social History:  reports that she has never smoked. She has never used smokeless tobacco. She reports that she does not drink alcohol or use drugs.   Allergies  Allergen Reactions  . Penicillins Swelling    History reviewed. No pertinent family history.    Prior to Admission medications   Medication Sig Start Date End Date Taking? Authorizing Provider  acetaminophen (TYLENOL) 325 MG tablet Take 650 mg by mouth every 6 (six) hours as needed for headache.   Yes [provider]  albuterol (VENTOLIN HFA) 108 (90 Base) MCG/ACT inhaler Inhale 2 puffs into the lungs every 4 (four) hours as needed for wheezing. 04/30/16  Yes [provider]  aspirin-acetaminophen-caffeine (EXCEDRIN MIGRAINE) [phone] MG tablet Take 1 tablet by mouth every 6 (six) hours as needed for headache.   Yes [provider]  Multiple Vitamin (MULTIVITAMIN) tablet Take 1 tablet by mouth daily.   Yes [provider]  ibuprofen (ADVIL,MOTRIN) 600 MG tablet Take 1 tablet (600 mg total) by mouth every 6 (six) hours as needed for pain. Patient not taking: Reported on 05/01/2019 09/19/12   Julianne Rice, MD     Objective    Physical Exam: Vitals:   05/01/19 1700 05/01/19 1702 05/01/19 1715 05/01/19 1730  BP: 134/87 134/87  135/80  Pulse: 96 99 (!) 101 (!) 101  Resp: (!) 22  (!) 21 14  Temp:      TempSrc:      SpO2: 100% 100% 100% 100%  Weight:       Height:        General: appears to be stated age; alert, oriented Skin: warm, dry, no rash Head:  AT/[place] Eyes:  PEARL b/l, EOMI Mouth:  Oral mucosa membranes appear dry, normal dentition Neck: supple; trachea midline Heart:  RRR; did not appreciate any M/R/G Lungs: CTAB, did not appreciate any wheezes, rales, or rhonchi Abdomen: + BS; soft, ND, NT Vascular: 2+ pedal pulses b/l; 2+ radial pulses b/l Extremities: no peripheral edema, no muscle wasting   Labs on Admission: I have personally reviewed following labs and imaging studies  CBC: Recent Labs  Lab 05/01/19 1500  WBC 10.5  NEUTROABS 6.7  HGB 7.5*  HCT 28.2*  MCV 68.3*  PLT [id number]*   Basic Metabolic Panel: Recent Labs  Lab 05/01/19 1500  NA 142  K 3.1*  CL 106  CO2 25  GLUCOSE 112*  BUN 12  CREATININE 0.77  CALCIUM 10.6*   GFR: Estimated Creatinine Clearance: 99.4 mL/min (by C-G formula based on SCr of 0.77 mg/dL). Liver Function Tests: Recent Labs  Lab 05/01/19 1500  AST 21  ALT 15  ALKPHOS 44  BILITOT 1.3*  PROT 8.4*  ALBUMIN 3.8   No results for  input(s): LIPASE, AMYLASE in the last 168 hours. No results for input(s): AMMONIA in the last 168 hours. Coagulation Profile: No results for input(s): INR, PROTIME in the last 168 hours. Cardiac Enzymes: No results for input(s): CKTOTAL, CKMB, CKMBINDEX, TROPONINI in the last 168 hours. BNP (last 3 results) No results for input(s): PROBNP in the last 8760 hours. HbA1C: No results for input(s): HGBA1C in the last 72 hours. CBG: No results for input(s): GLUCAP in the last 168 hours. Lipid Profile: Recent Labs    05/01/19 1500  TRIG 105   Thyroid Function Tests: No results for input(s): TSH, T4TOTAL, FREET4, T3FREE, THYROIDAB in the last 72 hours. Anemia Panel: Recent Labs    05/01/19 1500  FERRITIN 8*   Urine analysis: No results found for: COLORURINE, APPEARANCEUR, LABSPEC, PHURINE, GLUCOSEU, HGBUR, BILIRUBINUR, KETONESUR, PROTEINUR,  UROBILINOGEN, NITRITE, LEUKOCYTESUR  Radiological Exams on Admission: DG Chest Port 1 View  Result Date: 05/01/2019 CLINICAL DATA:  Chest pain, COVID-19 positive test 2 weeks ago, short of breath and cough EXAM: PORTABLE CHEST 1 VIEW COMPARISON:  04/30/2016 FINDINGS: Single frontal view of the chest demonstrates decreased lung volumes. There is accentuation of the central pulmonary vasculature and mild diffuse interstitial prominence, nonspecific. No airspace disease, effusion, or pneumothorax. IMPRESSION: 1. Low lung volumes with crowding of the central vasculature. 2. No acute airspace disease. Electronically Signed   By: Randa Ngo M.D.   On: 05/01/2019 16:22    Assessment/Plan   Yicel Sherlin is a 49 y.o. female with medical history significant for chronic iron deficiency anemia, menorrhagia, mild intermittent asthma, who is admitted to [facility] on 05/01/2019 with acute on chronic iron deficiency anemia after presenting from home to [facility] Emergency Department complaining of shortness of breath.    Principal Problem:   Acute on chronic anemia Active Problems:   SOB (shortness of breath)   COVID-19 virus infection   Hypokalemia   Asthma   #) Acute on chronic iron deficiency anemia: In the context of the patient's report of chronic iron deficiency anemia for which the patient reports that she was previously on oral iron supplementation, she presents this evening with hemoglobin of 7.5 relative to most recent prior value of 11.3 when checked in June 2014.  Patient's hemoglobin is 7.5 is found to be microcytic, hypochromic, and associated with an increased RDW, all of which appear to be consistent with iron deficiency anemia, which appears to be on the basis of the patient's report of monthly menorrhagia, as further described above.  Otherwise, no evidence of active versus recent bleed, including Hemoccult stool performed in the ED and found to be negative.  The  patient is normotensive, but mildly tachycardic.  Additionally, I feel that her acute exacerbation of chronic anemia can be considered symptomatic in nature given her report of worsening of shortness of breath following the onset of menorrhagia last week.  Consequently, in the setting of suspected symptomatic anemia, transfusion of 1 unit PRBC was initiated in the ED this evening.  We will also add on iron studies to labs collected prior to initiation of blood transfusion in order to determine if the patient would benefit from any IV iron supplementation.  The patient politely requests assistance and establishing with a new OB/GYN in order to manage her menorrhagia, which she reports has been occurring for several years now.   Plan: Continue transfusion of 1 unit PRBC.  Will repeat hemoglobin level 30 minutes following conclusion of this transfusion.  Repeat CBC  in the morning.  I have also ordered a repeat hemoglobin value to be checked at 10 AM on 05/02/2019.  Monitor on telemetry.  Add on iron studies to specimen collected prior to initiation of blood transfusion.  Add on reticulocyte count.  Check INR.     #) Suspected COVID-19 infection: The patient reports that she tested positive for COVID-19 approximately 2 weeks ago and subsequently became symptomatic with shortness of breath and nonproductive cough, both of which started to last weeks menorrhagia, causing suspicion that initial shortness of breath was contributed to by a COVID-19 infection.  However, unable to find reported COVID-19 positive test result in care everywhere, and the patient is unable to do produce patient at this test finding.  Consequently, COVID-19 PCR result has been checked in the ED this evening, and result is currently pending.  Presentation is not associate with any acute hypoxic respiratory distress at this time.  However, if the patient does test positive for COVID-19, would consider her symptomatic on the basis of the above  rationale, and would consider initiation of remdesivir on this premise.  Of note, presenting chest x-ray shows no evidence of acute cardiopulmonary process, while presenting general inflammatory markers noted to be elevated, as above.   Plan: We will follow for result of nasopharyngeal COVID-19 PCR obtained in the ED this evening, with consideration for initiation of remdesivir's at this does not be positive.  Monitor continuous pulse oximetry.  Monitor on telemetry.  Repeat general inflammatory markers in the morning.  Check ABG.  For now we will continue airborne and contact precautions.     #) Hypokalemia: Presenting labs reflect serum potassium of 3.1.  Plan: Potassium chloride 40 mEq p.o. x1 now.  Add on serum magnesium level.  Repeat BMP in the morning.  Monitor on telemetry.      #) Mild intermittent asthma: Respiratory regimen consists of as needed albuterol.  No evidence of acute asthma exacerbation at this time.  Plan: As needed albuterol inhaler.  In the setting of suspected COVID-19 infection, as above, will monitor on continuous pulse oximetry.    DVT prophylaxis: SCDs Code Status: Full code Family Communication: None Disposition Plan: Per Rounding Team Consults called: None Admission status: Inpatient; med telemetry   PLEASE NOTE THAT DRAGON DICTATION SOFTWARE WAS USED IN THE CONSTRUCTION OF THIS NOTE.   [place] [facility] Pager [phone] From [place].   Otherwise, please contact night-coverage  [email or web address] Password [facility]  05/01/2019, 6:45 PM

## 2019-05-01 NOTE — ED Triage Notes (Signed)
Patient states she was tested positive two weeks today off of [facility]. No record in Epic with [place] was found. She reports she is having shortness of breath and cough. Also, complains of bilateral leg pain.

## 2019-05-02 DIAGNOSIS — U071 COVID-19: Secondary | ICD-10-CM | POA: Diagnosis present

## 2019-05-02 DIAGNOSIS — J45909 Unspecified asthma, uncomplicated: Secondary | ICD-10-CM | POA: Diagnosis present

## 2019-05-02 DIAGNOSIS — E876 Hypokalemia: Secondary | ICD-10-CM | POA: Diagnosis present

## 2019-05-02 DIAGNOSIS — R0602 Shortness of breath: Secondary | ICD-10-CM | POA: Diagnosis present

## 2019-05-02 DIAGNOSIS — D649 Anemia, unspecified: Secondary | ICD-10-CM | POA: Diagnosis present

## 2019-05-02 LAB — PROTIME-INR
INR: 1.2 (ref 0.8–1.2)
Prothrombin Time: 14.9 seconds (ref 11.4–15.2)

## 2019-05-02 LAB — CBC WITH DIFFERENTIAL/PLATELET
Abs Immature Granulocytes: 0.04 10*3/uL (ref 0.00–0.07)
Basophils Absolute: 0.1 10*3/uL (ref 0.0–0.1)
Basophils Relative: 1 %
Eosinophils Absolute: 0.1 10*3/uL (ref 0.0–0.5)
Eosinophils Relative: 1 %
HCT: 29.5 % — ABNORMAL LOW (ref 36.0–46.0)
Hemoglobin: 8.3 g/dL — ABNORMAL LOW (ref 12.0–15.0)
Immature Granulocytes: 0 %
Lymphocytes Relative: 23 %
Lymphs Abs: 2.3 10*3/uL (ref 0.7–4.0)
MCH: 19.7 pg — ABNORMAL LOW (ref 26.0–34.0)
MCHC: 28.1 g/dL — ABNORMAL LOW (ref 30.0–36.0)
MCV: 70.1 fL — ABNORMAL LOW (ref 80.0–100.0)
Monocytes Absolute: 0.9 10*3/uL (ref 0.1–1.0)
Monocytes Relative: 9 %
Neutro Abs: 6.5 10*3/uL (ref 1.7–7.7)
Neutrophils Relative %: 66 %
Platelets: 510 10*3/uL — ABNORMAL HIGH (ref 150–400)
RBC: 4.21 MIL/uL (ref 3.87–5.11)
RDW: 21.4 % — ABNORMAL HIGH (ref 11.5–15.5)
WBC: 10 10*3/uL (ref 4.0–10.5)
nRBC: 0 % (ref 0.0–0.2)

## 2019-05-02 LAB — HEMOGLOBIN AND HEMATOCRIT, BLOOD
HCT: 28.9 % — ABNORMAL LOW (ref 36.0–46.0)
Hemoglobin: 8.2 g/dL — ABNORMAL LOW (ref 12.0–15.0)

## 2019-05-02 LAB — TYPE AND SCREEN
ABO/RH(D): O POS
Antibody Screen: NEGATIVE
Unit division: 0

## 2019-05-02 LAB — COMPREHENSIVE METABOLIC PANEL
ALT: 15 U/L (ref 0–44)
AST: 20 U/L (ref 15–41)
Albumin: 3.5 g/dL (ref 3.5–5.0)
Alkaline Phosphatase: 42 U/L (ref 38–126)
Anion gap: 10 (ref 5–15)
BUN: 14 mg/dL (ref 6–20)
CO2: 26 mmol/L (ref 22–32)
Calcium: 10.5 mg/dL — ABNORMAL HIGH (ref 8.9–10.3)
Chloride: 106 mmol/L (ref 98–111)
Creatinine, Ser: 0.82 mg/dL (ref 0.44–1.00)
GFR calc Af Amer: >60 mL/min (ref >60)
GFR calc non Af Amer: >60 mL/min (ref >60)
Glucose, Bld: 110 mg/dL — ABNORMAL HIGH (ref 70–99)
Potassium: 3.9 mmol/L (ref 3.5–5.1)
Sodium: 142 mmol/L (ref 135–145)
Total Bilirubin: 1.3 mg/dL — ABNORMAL HIGH (ref 0.3–1.2)
Total Protein: 8 g/dL (ref 6.5–8.1)

## 2019-05-02 LAB — SARS CORONAVIRUS 2 (TAT 6-24 HRS): SARS Coronavirus 2: POSITIVE — AB

## 2019-05-02 LAB — ABO/RH: ABO/RH(D): O POS

## 2019-05-02 LAB — BPAM RBC
Blood Product Expiration Date: 202102282359
ISSUE DATE / TIME: 202102031939
Unit Type and Rh: 5100

## 2019-05-02 LAB — FERRITIN: Ferritin: 9 ng/mL — ABNORMAL LOW (ref 11–307)

## 2019-05-02 LAB — D-DIMER, QUANTITATIVE: D-Dimer, Quant: 2.52 ug/mL-FEU — ABNORMAL HIGH (ref 0.00–0.50)

## 2019-05-02 LAB — MAGNESIUM: Magnesium: 2.5 mg/dL — ABNORMAL HIGH (ref 1.7–2.4)

## 2019-05-02 LAB — HIV ANTIBODY (ROUTINE TESTING W REFLEX): HIV Screen 4th Generation wRfx: NONREACTIVE

## 2019-05-02 LAB — LACTATE DEHYDROGENASE: LDH: 215 U/L — ABNORMAL HIGH (ref 98–192)

## 2019-05-02 LAB — C-REACTIVE PROTEIN: CRP: 1 mg/dL — ABNORMAL HIGH (ref <1.0)

## 2019-05-02 LAB — PREPARE RBC (CROSSMATCH)

## 2019-05-02 LAB — PATHOLOGIST SMEAR REVIEW

## 2019-05-02 LAB — FIBRINOGEN: Fibrinogen: 384 mg/dL (ref 210–475)

## 2019-05-02 MED ORDER — SODIUM CHLORIDE 0.9% IV SOLUTION: Freq: Once | INTRAVENOUS | Status: AC

## 2019-05-02 MED ORDER — ZINC SULFATE 220 (50 ZN) MG PO CAPS
220.0000 mg | ORAL_CAPSULE | Freq: Every day | ORAL | Status: DC
  Administered 2019-05-02 – 2019-05-04 (×3): 220 mg via ORAL
  Filled 2019-05-02 (×3): qty 1

## 2019-05-02 MED ORDER — GUAIFENESIN-DM 100-10 MG/5ML PO SYRP
10.0000 mL | ORAL_SOLUTION | ORAL | Status: DC | PRN
  Administered 2019-05-03 – 2019-05-04 (×4): 10 mL via ORAL
  Filled 2019-05-02 (×3): qty 10

## 2019-05-02 MED ORDER — ENOXAPARIN SODIUM 60 MG/0.6ML ~~LOC~~ SOLN
55.0000 mg | SUBCUTANEOUS | Status: DC
  Administered 2019-05-02: 55 mg via SUBCUTANEOUS
  Filled 2019-05-02: qty 0.6

## 2019-05-02 MED ORDER — DEXAMETHASONE 6 MG PO TABS
6.0000 mg | ORAL_TABLET | Freq: Every day | ORAL | Status: DC
  Administered 2019-05-02: 6 mg via ORAL
  Filled 2019-05-02: qty 1

## 2019-05-02 MED ORDER — ADULT MULTIVITAMIN W/MINERALS CH
1.0000 | ORAL_TABLET | Freq: Every day | ORAL | Status: DC
  Administered 2019-05-02 – 2019-05-04 (×3): 1 via ORAL
  Filled 2019-05-02 (×3): qty 1

## 2019-05-02 MED ORDER — SODIUM CHLORIDE 0.9 % IV SOLN
200.0000 mg | Freq: Once | INTRAVENOUS | Status: AC
  Administered 2019-05-02: 200 mg via INTRAVENOUS
  Filled 2019-05-02: qty 200

## 2019-05-02 MED ORDER — HYDROCOD POLST-CPM POLST ER 10-8 MG/5ML PO SUER: 5.0000 mL | Freq: Two times a day (BID) | ORAL | Status: DC | PRN

## 2019-05-02 MED ORDER — SODIUM CHLORIDE 0.9 % IV SOLN
510.0000 mg | Freq: Once | INTRAVENOUS | Status: AC
  Administered 2019-05-02: 510 mg via INTRAVENOUS
  Filled 2019-05-02: qty 17

## 2019-05-02 MED ORDER — SODIUM CHLORIDE 0.9 % IV SOLN
100.0000 mg | Freq: Every day | INTRAVENOUS | Status: DC
  Administered 2019-05-03 – 2019-05-04 (×2): 100 mg via INTRAVENOUS
  Filled 2019-05-02 (×2): qty 20

## 2019-05-02 MED ORDER — FERROUS SULFATE 325 (65 FE) MG PO TABS
325.0000 mg | ORAL_TABLET | Freq: Every day | ORAL | Status: DC
  Administered 2019-05-03 – 2019-05-04 (×2): 325 mg via ORAL
  Filled 2019-05-02 (×2): qty 1

## 2019-05-02 MED ORDER — ASCORBIC ACID 500 MG PO TABS
500.0000 mg | ORAL_TABLET | Freq: Every day | ORAL | Status: DC
  Administered 2019-05-02 – 2019-05-04 (×3): 500 mg via ORAL
  Filled 2019-05-02 (×3): qty 1

## 2019-05-02 NOTE — Progress Notes (Signed)
PROGRESS NOTE  Christine Spencer [id number] DOB: October 24, 1970 DOA: 05/01/2019 PCP: Patient, No Pcp Per   HPI/Recap of past 24 hours: HPI from Dr Velia Meyer Christine Spencer is a 49 y.o. female with medical history significant for chronic iron deficiency anemia, menorrhagia, mild intermittent asthma, presented to the ED complaining of shortness of breath.  Patient reports COVID-19 exposure 2 weeks ago at her place of employment, after which she subsequently tested positive.  Patient was initially asymptomatic, before subsequently developed pain nonproductive cough and shortness of breath for the past 10 days.  Patient also has a history of menorrhagia, of which she reported worsening shortness of breath during her period.  In the ED, patient saturating well on room air, afebrile, mildly tachycardic, with some tachypnea.  Labs show hemoglobin of 7.5 (baseline around 11), pregnancy test negative, Hemoccult stool negative, inflammatory markers mildly elevated.  COVID-19 PCR positive.  Chest x-ray showed no evidence of acute cardiopulmonary process.  Patient received 1 unit of PRBC in the ED.  Patient admitted for further management.     Today, saw patient briefly while [facility] was about to transport patient.  Still reports shortness of breath, but slight improvement.  Denies any chest pain, abdominal pain, nausea/vomiting, fever/chills.    Assessment/Plan: Principal Problem:   Acute on chronic anemia Active Problems:   SOB (shortness of breath)   COVID-19 virus infection   Hypokalemia   Asthma   Anemia   COVID-19 infection Currently afebrile, with no leukocytosis Saturating well on room air, although reports shortness of breath Inflammatory markers slightly elevated, will trend Chest x-ray unremarkable Due to her shortness of breath, tachypnea, history of asthma, remdesivir and Decadron were started, will continue Supplemental oxygen as needed, inhalers, incentive spirometry Vitamins,  cough suppressant  Acute on chronic iron deficiency anemia Presented with a hemoglobin of 7.5, baseline around 11.3 Likely 2/2 menorrhagia, FOBT negative Anemia panel showed iron 18, sats 5, ferritin 9 Status post 1 unit of PRBC Give a dose of Feraheme on 05/02/2019, start daily oral iron supplementation Patient advised to follow-up with an OB/GYN as an outpatient Daily CBC  Mild intermittent asthma Continue as needed albuterol For the management as above  Morbid obesity Lifestyle modification advised          Malnutrition Type:      Malnutrition Characteristics:      Nutrition Interventions:       Estimated body mass index is 48.18 kg/m as calculated from the following:   Height as of this encounter: 5\' 1"  (1.549 m).   Weight as of this encounter: 115.7 kg.     Code Status: Full  Family Communication: None at bedside  Disposition Plan: Plan to DC home once hemoglobin has stabilized, as well as overall clinical improvement.   Consultants:  None  Procedures:  None  Antimicrobials:  None  DVT prophylaxis: SCDs   Objective: Vitals:   05/02/19 0500 05/02/19 0530 05/02/19 0700 05/02/19 0800  BP: 119/76 (!) 138/94 (!) 117/97 (!) 136/92  Pulse: 92 93 92 91  Resp: (!) 35 (!) 28 20 (!) 22  Temp:      TempSrc:      SpO2: 97% 96% 98% 95%  Weight:      Height:        Intake/Output Summary (Last 24 hours) at 05/02/2019 1034 Last data filed at 05/02/2019 0716 Gross per 24 hour  Intake 880 ml  Output --  Net 880 ml   Filed Weights   05/01/19 1443 05/02/19  0116  Weight: 113.4 kg 115.7 kg    Exam:  General: NAD   Cardiovascular: S1, S2 present  Respiratory: CTAB  Abdomen: Soft, nontender, nondistended, bowel sounds present  Musculoskeletal: No bilateral pedal edema noted  Skin: Normal  Psychiatry: Normal mood   Data Reviewed: CBC: Recent Labs  Lab 05/01/19 1500 05/02/19 0316  WBC 10.5 10.0  NEUTROABS 6.7 6.5  HGB 7.5*  8.3*  HCT 28.2* 29.5*  MCV 68.3* 70.1*  PLT 602* 99991111*   Basic Metabolic Panel: Recent Labs  Lab 05/01/19 1500 05/02/19 0316  NA 142 142  K 3.1* 3.9  CL 106 106  CO2 25 26  GLUCOSE 112* 110*  BUN 12 14  CREATININE 0.77 0.82  CALCIUM 10.6* 10.5*  MG 2.5* 2.5*   GFR: Estimated Creatinine Clearance: 98.3 mL/min (by C-G formula based on SCr of 0.82 mg/dL). Liver Function Tests: Recent Labs  Lab 05/01/19 1500 05/02/19 0316  AST 21 20  ALT 15 15  ALKPHOS 44 42  BILITOT 1.3* 1.3*  PROT 8.4* 8.0  ALBUMIN 3.8 3.5   No results for input(s): LIPASE, AMYLASE in the last 168 hours. No results for input(s): AMMONIA in the last 168 hours. Coagulation Profile: Recent Labs  Lab 05/01/19 1500 05/02/19 0316  INR 1.1 1.2   Cardiac Enzymes: No results for input(s): CKTOTAL, CKMB, CKMBINDEX, TROPONINI in the last 168 hours. BNP (last 3 results) No results for input(s): PROBNP in the last 8760 hours. HbA1C: No results for input(s): HGBA1C in the last 72 hours. CBG: No results for input(s): GLUCAP in the last 168 hours. Lipid Profile: Recent Labs    05/01/19 1500  TRIG 105   Thyroid Function Tests: No results for input(s): TSH, T4TOTAL, FREET4, T3FREE, THYROIDAB in the last 72 hours. Anemia Panel: Recent Labs    05/01/19 1500 05/01/19 2058 05/01/19 2102 05/02/19 0316  FERRITIN 8*  --   --  9*  TIBC  --   --  363  --   IRON  --   --  18*  --   RETICCTPCT  --  2.1  --   --    Urine analysis: No results found for: COLORURINE, APPEARANCEUR, LABSPEC, PHURINE, GLUCOSEU, HGBUR, BILIRUBINUR, KETONESUR, PROTEINUR, UROBILINOGEN, NITRITE, LEUKOCYTESUR Sepsis Labs: @LABRCNTIP (procalcitonin:4,lacticidven:4)  ) Recent Results (from the past 240 hour(s))  SARS CORONAVIRUS 2 (TAT 6-24 HRS) Nasopharyngeal Nasopharyngeal Swab     Status: Abnormal   Collection Time: 05/01/19  7:59 PM   Specimen: Nasopharyngeal Swab  Result Value Ref Range Status   SARS Coronavirus 2 POSITIVE (A)  NEGATIVE Final    Comment: RESULT CALLED TO, READ BACK BY AND VERIFIED WITH: RN RYAN CHRISTIAN  AT 0427 BY MESSAN H. ON 05/02/2019 (NOTE) SARS-CoV-2 target nucleic acids are DETECTED. The SARS-CoV-2 RNA is generally detectable in upper and lower respiratory specimens during the acute phase of infection. Positive results are indicative of the presence of SARS-CoV-2 RNA. Clinical correlation with patient history and other diagnostic information is  necessary to determine patient infection status. Positive results do not rule out bacterial infection or co-infection with other viruses.  The expected result is Negative. Fact Sheet for Patients: SugarRoll.be Fact Sheet for Healthcare Providers: https://www.woods-mathews.com/ This test is not yet approved or cleared by the [place] FDA and  has been authorized for detection and/or diagnosis of SARS-CoV-2 by FDA under an Emergency Use Authorization (EUA). This EUA will remain  in effect (meaning this test can  be used) for the duration of the  COVID-19 declaration under Section 564(b)(1) of the Act, 21 U.S.C. section 360bbb-3(b)(1), unless the authorization is terminated or revoked sooner. Performed at [facility], [place] [address] [place], [place] [address]       Studies: DG Chest Port 1 View  Result Date: 05/01/2019 CLINICAL DATA:  Chest pain, COVID-19 positive test 2 weeks ago, short of breath and cough EXAM: PORTABLE CHEST 1 VIEW COMPARISON:  04/30/2016 FINDINGS: Single frontal view of the chest demonstrates decreased lung volumes. There is accentuation of the central pulmonary vasculature and mild diffuse interstitial prominence, nonspecific. No airspace disease, effusion, or pneumothorax. IMPRESSION: 1. Low lung volumes with crowding of the central vasculature. 2. No acute airspace disease. Electronically Signed   By: Randa Ngo M.D.   On: 05/01/2019 16:22    Scheduled Meds: .  vitamin C  500 mg Oral Daily  . dexamethasone  6 mg Oral Daily  . multivitamin with minerals  1 tablet Oral Daily  . sodium chloride flush  3 mL Intravenous Q12H  . zinc sulfate  220 mg Oral Daily    Continuous Infusions: . sodium chloride    . [START ON 05/03/2019] remdesivir 100 mg in NS 100 mL       LOS: 1 day     Alma Friendly, MD [facility]  If 7PM-7AM, please contact night-coverage [email or web address] 05/02/2019, 10:34 AM

## 2019-05-02 NOTE — Progress Notes (Addendum)
Saw patient at [facility] on transfer. She feels less weak with exertion, improved dyspnea since admission but still moderately short of breath worse with exertion. No chest pain, no palpitations. Denies any ongoing bleeding. Her cough is persistent, severe, and associated with increasing dyspnea.   Remains tachycardic and tachypneic but not hypoxemic.   - Stop steroids.  - Continue remdesivir. - Discussed risks/benefits of IV iron with patient who consents, confirmed availability of feraheme with pharmacy will transfuse now.  - Recheck H/H this PM, pt agrees to repeat transfusion if hgb < 8.3g/dl given her persistent tachycardia and still significant departure from baseline hgb/symptomatic anemia.  - Recheck labs in AM. If labs and vital signs are stable, will likely DC 2/5.  Vance Gather, MD 05/02/2019 11:35 AM

## 2019-05-02 NOTE — Plan of Care (Signed)

## 2019-05-02 NOTE — ED Notes (Signed)
[facility] to transport patient to [place].

## 2019-05-03 ENCOUNTER — Inpatient Hospital Stay (HOSPITAL_COMMUNITY): Payer: BLUE CROSS/BLUE SHIELD

## 2019-05-03 DIAGNOSIS — R7989 Other specified abnormal findings of blood chemistry: Secondary | ICD-10-CM

## 2019-05-03 DIAGNOSIS — J4520 Mild intermittent asthma, uncomplicated: Secondary | ICD-10-CM

## 2019-05-03 LAB — CBC WITH DIFFERENTIAL/PLATELET
Abs Immature Granulocytes: 0.03 10*3/uL (ref 0.00–0.07)
Basophils Absolute: 0.1 10*3/uL (ref 0.0–0.1)
Basophils Relative: 1 %
Eosinophils Absolute: 0 10*3/uL (ref 0.0–0.5)
Eosinophils Relative: 0 %
HCT: 30.9 % — ABNORMAL LOW (ref 36.0–46.0)
Hemoglobin: 8.9 g/dL — ABNORMAL LOW (ref 12.0–15.0)
Immature Granulocytes: 0 %
Lymphocytes Relative: 18 %
Lymphs Abs: 2.1 10*3/uL (ref 0.7–4.0)
MCH: 20.9 pg — ABNORMAL LOW (ref 26.0–34.0)
MCHC: 28.8 g/dL — ABNORMAL LOW (ref 30.0–36.0)
MCV: 72.5 fL — ABNORMAL LOW (ref 80.0–100.0)
Monocytes Absolute: 1.1 10*3/uL — ABNORMAL HIGH (ref 0.1–1.0)
Monocytes Relative: 10 %
Neutro Abs: 8.4 10*3/uL — ABNORMAL HIGH (ref 1.7–7.7)
Neutrophils Relative %: 71 %
Platelets: 490 10*3/uL — ABNORMAL HIGH (ref 150–400)
RBC: 4.26 MIL/uL (ref 3.87–5.11)
RDW: 22.3 % — ABNORMAL HIGH (ref 11.5–15.5)
WBC: 11.8 10*3/uL — ABNORMAL HIGH (ref 4.0–10.5)
nRBC: 0 % (ref 0.0–0.2)

## 2019-05-03 LAB — TYPE AND SCREEN
ABO/RH(D): O POS
Antibody Screen: NEGATIVE
Unit division: 0

## 2019-05-03 LAB — APTT: aPTT: 38 seconds — ABNORMAL HIGH (ref 24–36)

## 2019-05-03 LAB — BPAM RBC
Blood Product Expiration Date: 202103082359
ISSUE DATE / TIME: 202102041925
Unit Type and Rh: 5100

## 2019-05-03 LAB — HEPARIN LEVEL (UNFRACTIONATED): Heparin Unfractionated: <0.1 IU/mL — ABNORMAL LOW (ref 0.30–0.70)

## 2019-05-03 LAB — FERRITIN: Ferritin: 19 ng/mL (ref 11–307)

## 2019-05-03 LAB — HEMOGLOBIN AND HEMATOCRIT, BLOOD
HCT: 29.3 % — ABNORMAL LOW (ref 36.0–46.0)
Hemoglobin: 8.5 g/dL — ABNORMAL LOW (ref 12.0–15.0)

## 2019-05-03 LAB — D-DIMER, QUANTITATIVE: D-Dimer, Quant: 2.91 ug/mL-FEU — ABNORMAL HIGH (ref 0.00–0.50)

## 2019-05-03 MED ORDER — HEPARIN (PORCINE) 25000 UT/250ML-% IV SOLN
1300.0000 [IU]/h | INTRAVENOUS | Status: AC
  Administered 2019-05-03: 900 [IU]/h via INTRAVENOUS
  Filled 2019-05-03: qty 250

## 2019-05-03 NOTE — Progress Notes (Signed)
Patient's PRBC was done at 2328 on 05/02/19. Labs were drawn at [facility] on 05/03/19. H/H drawn too early. Put in for a repeat draw.

## 2019-05-03 NOTE — Progress Notes (Signed)
PROGRESS NOTE  Christine Spencer  [id number] DOB: 28-Nov-1970 DOA: 05/01/2019 PCP: Patient, No Pcp Per Brief Narrative: Christine Spencer is a 49 y.o. female with a history of iron deficiency anemia, mild intermittent asthma, and menorrhagia who presented with progressive shortness of breath having recently completed a menstrual cycle. She'd also had a cough and had been found to be positive for covid-19 approximately 2 weeks prior after exposure at work. Evaluation in the ED revealed microcytic anemia with hemoglobin of 7.5g/dl (baseline 11-12) and low ferritin, UPT negative, FOBT negative, SARS-CoV-2 PCR positive, CRP 1.0. CXR without infiltrate and despite significant exertional dyspnea, she was not hypoxemic. She was given 1u PRBCs, remdesivir and transferred to [facility]. Steroids had been given but were stopped due to no hypoxemia. An additional unit of PRBCs and IV iron were given 2/4 and hemoglobin rebounded to 8.9, subsequently down to 8.5g/dl despite no active bleeding reported. Due to continued elevation of d-dimer in light of minimally elevated CRP, LDH, lower extremity venous U/S was performed revealing bilateral acute DVTs per preliminary report. Due need for therapeutic anticoagulation in the face of continued transfusion-dependent anemia, IV heparin is started and CBC will continue to be monitored.   Assessment & Plan: Principal Problem:   Acute on chronic anemia Active Problems:   SOB (shortness of breath)   COVID-19 virus infection   Hypokalemia   Asthma   Anemia  Symptomatic acute blood loss anemia on chronic iron deficiency anemia: Due presumably to menorrhagia per pt history. Has had unrevealing colonoscopy and EGD, negative FOBT in ED, and no other reports of bleeding.  - Continue monitoring serial CBC.  - s/p 2u PRBCs 2/4 with hgb 7.5 >> 8.5.  - Given IV iron 2/4, continue po supplementation as well.  - Will need close PCP follow up with labs within a week of discharge. CM consulted.    Menorrhagia:  - Needs close OB/GYN follow up and strong consideration of prn medication to stop menstruation (e.g. megace).  Covid-19 infection: At high risk of progression and remains tachypneic, dyspneic. SARS-CoV-2 Ag negative, PCR positive 2/3 - Continue remdesivir 2/4 - 2/8, would be candidate for outpatient infusions if able to DC prior to completing course. - Stopped steroids with no hypoxemia or infiltrates.  - Continue isolation x21 days.   Acute bilateral lower extremity DVTs: Based on preliminary U/S 2/5. No clinical evidence of pulmonary embolism, so will forego CTA chest.  - Transition prophylactic lovenox to IV heparin due to need to stop abruptly if bleeding noted.  - Plan to transition to po anticoagulation if CBC stable for 3-6 months.  Mild intermittent asthma: No exacerbation - Continue prn albuterol  Obesity: BMI 44. Noted.  DVT prophylaxis: IV heparin Code Status: Full Family Communication: Mother on speaker phone throughout encounter Disposition Plan: Discharge delayed by need to monitor blood counts and clinically while starting anticoagulation with transfusion-dependent anemia.  Consultants:   None  Procedures:   Lower extremity venous U/S 05/03/2019: Formal result pending.  Antimicrobials:  Remdesivir 2/4 - 2/8   Subjective: Shortness of breath improved, still some cough, no bleeding. Denies chest pain.   Objective: Vitals:   05/02/19 2328 05/03/19 0411 05/03/19 0500 05/03/19 0800  BP: 125/87 124/78  123/84  Pulse: (!) 104 96  83  Resp: (!) 22 (!) 21  (!) 22  Temp: 98.5 F (36.9 C) 98.5 F (36.9 C)  98.2 F (36.8 C)  TempSrc: Oral Oral  Oral  SpO2: 96% 94%  96%  Weight:  107.1 kg   Height:   5\' 1"  (1.549 m)     Intake/Output Summary (Last 24 hours) at 05/03/2019 1135 Last data filed at 05/03/2019 0500 Gross per 24 hour  Intake 1635 ml  Output -  Net 1635 ml   Filed Weights   05/01/19 1443 05/02/19 0116 05/03/19 0500  Weight: 113.4  kg 115.7 kg 107.1 kg    Gen: 49 y.o. female in no distress  Pulm: Non-labored breathing room air. Clear to auscultation bilaterally.  CV: Regular rate and rhythm. No murmur, rub, or gallop. No JVD, no pedal edema. GI: Abdomen soft, non-tender, non-distended, with normoactive bowel sounds. No organomegaly or masses felt. Ext: Warm, no deformities Skin: No rashes, lesions or ulcers Neuro: Alert and oriented. No focal neurological deficits. Psych: Judgement and insight appear normal. Mood & affect appropriate.   Data Reviewed: I have personally reviewed following labs and imaging studies  CBC: Recent Labs  Lab 05/01/19 1500 05/02/19 0316 05/02/19 1415 05/03/19 0029 05/03/19 0444  WBC 10.5 10.0  --  11.8*  --   NEUTROABS 6.7 6.5  --  8.4*  --   HGB 7.5* 8.3* 8.2* 8.9* 8.5*  HCT 28.2* 29.5* 28.9* 30.9* 29.3*  MCV 68.3* 70.1*  --  72.5*  --   PLT 602* 510*  --  490*  --    Basic Metabolic Panel: Recent Labs  Lab 05/01/19 1500 05/02/19 0316  NA 142 142  K 3.1* 3.9  CL 106 106  CO2 25 26  GLUCOSE 112* 110*  BUN 12 14  CREATININE 0.77 0.82  CALCIUM 10.6* 10.5*  MG 2.5* 2.5*   GFR: Estimated Creatinine Clearance: 93.7 mL/min (by C-G formula based on SCr of 0.82 mg/dL). Liver Function Tests: Recent Labs  Lab 05/01/19 1500 05/02/19 0316  AST 21 20  ALT 15 15  ALKPHOS 44 42  BILITOT 1.3* 1.3*  PROT 8.4* 8.0  ALBUMIN 3.8 3.5   No results for input(s): LIPASE, AMYLASE in the last 168 hours. No results for input(s): AMMONIA in the last 168 hours. Coagulation Profile: Recent Labs  Lab 05/01/19 1500 05/02/19 0316  INR 1.1 1.2   Cardiac Enzymes: No results for input(s): CKTOTAL, CKMB, CKMBINDEX, TROPONINI in the last 168 hours. BNP (last 3 results) No results for input(s): PROBNP in the last 8760 hours. HbA1C: No results for input(s): HGBA1C in the last 72 hours. CBG: No results for input(s): GLUCAP in the last 168 hours. Lipid Profile: Recent Labs     05/01/19 1500  TRIG 105   Thyroid Function Tests: No results for input(s): TSH, T4TOTAL, FREET4, T3FREE, THYROIDAB in the last 72 hours. Anemia Panel: Recent Labs    05/01/19 1500 05/01/19 2058 05/01/19 2102 05/02/19 0316 05/03/19 0029  FERRITIN   < >  --   --  9* 19  TIBC  --   --  363  --   --   IRON  --   --  18*  --   --   RETICCTPCT  --  2.1  --   --   --    < > = values in this interval not displayed.   Urine analysis: No results found for: COLORURINE, APPEARANCEUR, LABSPEC, PHURINE, GLUCOSEU, HGBUR, BILIRUBINUR, KETONESUR, PROTEINUR, UROBILINOGEN, NITRITE, LEUKOCYTESUR Recent Results (from the past 240 hour(s))  SARS CORONAVIRUS 2 (TAT 6-24 HRS) Nasopharyngeal Nasopharyngeal Swab     Status: Abnormal   Collection Time: 05/01/19  7:59 PM   Specimen: Nasopharyngeal Swab  Result Value Ref  Range Status   SARS Coronavirus 2 POSITIVE (A) NEGATIVE Final    Comment: RESULT CALLED TO, READ BACK BY AND VERIFIED WITH: RN Teodoro Jeffreys CHRISTIAN  AT 0427 BY MESSAN H. ON 05/02/2019 (NOTE) SARS-CoV-2 target nucleic acids are DETECTED. The SARS-CoV-2 RNA is generally detectable in upper and lower respiratory specimens during the acute phase of infection. Positive results are indicative of the presence of SARS-CoV-2 RNA. Clinical correlation with patient history and other diagnostic information is  necessary to determine patient infection status. Positive results do not rule out bacterial infection or co-infection with other viruses.  The expected result is Negative. Fact Sheet for Patients: SugarRoll.be Fact Sheet for Healthcare Providers: https://www.woods-mathews.com/ This test is not yet approved or cleared by the [place] FDA and  has been authorized for detection and/or diagnosis of SARS-CoV-2 by FDA under an Emergency Use Authorization (EUA). This EUA will remain  in effect (meaning this test can  be used) for the duration of the  COVID-19 declaration under Section 564(b)(1) of the Act, 21 U.S.C. section 360bbb-3(b)(1), unless the authorization is terminated or revoked sooner. Performed at [facility], [place] [address] [place], [place] [address]       Radiology Studies: DG Chest Port 1 View  Result Date: 05/01/2019 CLINICAL DATA:  Chest pain, COVID-19 positive test 2 weeks ago, short of breath and cough EXAM: PORTABLE CHEST 1 VIEW COMPARISON:  04/30/2016 FINDINGS: Single frontal view of the chest demonstrates decreased lung volumes. There is accentuation of the central pulmonary vasculature and mild diffuse interstitial prominence, nonspecific. No airspace disease, effusion, or pneumothorax. IMPRESSION: 1. Low lung volumes with crowding of the central vasculature. 2. No acute airspace disease. Electronically Signed   By: Randa Ngo M.D.   On: 05/01/2019 16:22    Scheduled Meds: . vitamin C  500 mg Oral Daily  . enoxaparin (LOVENOX) injection  55 mg Subcutaneous Q24H  . ferrous sulfate  325 mg Oral Q breakfast  . multivitamin with minerals  1 tablet Oral Daily  . sodium chloride flush  3 mL Intravenous Q12H  . zinc sulfate  220 mg Oral Daily   Continuous Infusions: . sodium chloride    . remdesivir 100 mg in NS 100 mL 100 mg (05/03/19 1055)     LOS: 2 days   Time spent: 35 minutes.  Patrecia Pour, MD [facility] [email or web address] 05/03/2019, 11:35 AM

## 2019-05-03 NOTE — Plan of Care (Signed)

## 2019-05-03 NOTE — Progress Notes (Signed)
Bilateral lower extremity venous duplex has been completed. Preliminary results can be found in CV Proc through chart review.  Results were given to the patient's nurse, Chrissie Noa.  05/03/19 11:21 AM Carlos Levering RVT

## 2019-05-03 NOTE — Progress Notes (Signed)
ANTICOAGULATION CONSULT NOTE - Initial Consult  Pharmacy Consult for Heparin Indication: DVT  Allergies  Allergen Reactions  . Penicillins Swelling    Patient Measurements: Height: 5\' 1"  (154.9 cm) Weight: 236 lb 1.8 oz (107.1 kg)(standing scale) IBW/kg (Calculated) : 47.8 Heparin Dosing Weight: 74 kg  Vital Signs: Temp: 98.2 F (36.8 C) (02/05 0800) Temp Source: Oral (02/05 0800) BP: 123/84 (02/05 0800) Pulse Rate: 83 (02/05 0800)  Labs: Recent Labs    05/01/19 1500 05/01/19 1500 05/01/19 1729 05/02/19 0316 05/02/19 0316 05/02/19 1415 05/02/19 1415 05/03/19 0029 05/03/19 0444  HGB 7.5*   < >  --  8.3*   < > 8.2*   < > 8.9* 8.5*  HCT 28.2*   < >  --  29.5*   < > 28.9*  --  30.9* 29.3*  PLT 602*  --   --  510*  --   --   --  490*  --   LABPROT 13.9  --   --  14.9  --   --   --   --   --   INR 1.1  --   --  1.2  --   --   --   --   --   CREATININE 0.77  --   --  0.82  --   --   --   --   --   TROPONINIHS <2  --  <2.00  --   --   --   --   --   --    < > = values in this interval not displayed.    Estimated Creatinine Clearance: 93.7 mL/min (by C-G formula based on SCr of 0.82 mg/dL).   Medical History: Past Medical History:  Diagnosis Date  . Asthma   . Bronchitis   . Morbid obesity (HCC)     Medications:  Scheduled:  . vitamin C  500 mg Oral Daily  . ferrous sulfate  325 mg Oral Q breakfast  . multivitamin with minerals  1 tablet Oral Daily  . sodium chloride flush  3 mL Intravenous Q12H  . zinc sulfate  220 mg Oral Daily    Assessment: 49 y/o F with a h/o chronic iron deficiency anemia and menorrhagia admitted with anemia and COVID-19 infection. Patient has been transfused and received feraheme. Last dose of Lovenox 2/4. Pharmacy consulted for heparin infusion for new DVT.   Hgb low post-transfusion No active bleeding reported  Goal of Therapy:  Heparin level 0.3-0.5  units/ml Monitor platelets by anticoagulation protocol: Yes   Plan:  Start  heparin infusion at 900 units/hr Check anti-Xa level in 6 hours and daily while on heparin Continue to monitor H&H and platelets  Ulice Dash D 05/03/2019,11:42 AM

## 2019-05-03 NOTE — TOC Transition Note (Signed)
Transition of Care [facility]) - CM/SW Discharge Note   Patient Details  Name: Christine Spencer MRN: [id number] Date of Birth: 09-14-1970  [facility] CM/SW Contact:  Leeroy Cha, RN Phone Number: 05/03/2019, 10:08 AM   Clinical Narrative:    dcd to home  . Orders checked for dme, hhc or cm needs. insturction to make appointment with the health and wellness clinic given.  Final next level of care: Home/Self Care Barriers to Discharge: No Barriers Identified   Patient Goals and CMS Choice Patient states their goals for this hospitalization and ongoing recovery are:: to go home CMS Medicare.gov Compare Post Acute Care list provided to:: Patient    Discharge Placement                       Discharge Plan and Services   Discharge Planning Services: CM Consult, [facility]                                 Social Determinants of Health (SDOH) Interventions     Readmission Risk Interventions No flowsheet data found.

## 2019-05-03 NOTE — Progress Notes (Signed)
ANTICOAGULATION CONSULT NOTE   Pharmacy Consult for Heparin Indication: DVT  Assessment: 49 y/o F with a h/o chronic iron deficiency anemia and menorrhagia admitted with anemia and COVID-19 infection. Patient has been transfused and received feraheme. Last dose of Lovenox 2/4. Pharmacy consulted for heparin infusion for new DVT.   Hgb low post-transfusion No active bleeding reported Initial heparin level <0.1 units/ml  Goal of Therapy:  Heparin level 0.3-0.5  units/ml Monitor platelets by anticoagulation protocol: Yes   Plan:  Increase heparin drip to 1100 units/hr Check heparin level in 6 hours  Christine Spencer 05/03/2019,11:41 PM

## 2019-05-04 LAB — HEPARIN LEVEL (UNFRACTIONATED): Heparin Unfractionated: <0.1 IU/mL — ABNORMAL LOW (ref 0.30–0.70)

## 2019-05-04 LAB — CBC WITH DIFFERENTIAL/PLATELET
Abs Immature Granulocytes: 0.05 10*3/uL (ref 0.00–0.07)
Basophils Absolute: 0.1 10*3/uL (ref 0.0–0.1)
Basophils Relative: 1 %
Eosinophils Absolute: 0.2 10*3/uL (ref 0.0–0.5)
Eosinophils Relative: 1 %
HCT: 32.4 % — ABNORMAL LOW (ref 36.0–46.0)
Hemoglobin: 9.3 g/dL — ABNORMAL LOW (ref 12.0–15.0)
Immature Granulocytes: 0 %
Lymphocytes Relative: 24 %
Lymphs Abs: 2.8 10*3/uL (ref 0.7–4.0)
MCH: 20.8 pg — ABNORMAL LOW (ref 26.0–34.0)
MCHC: 28.7 g/dL — ABNORMAL LOW (ref 30.0–36.0)
MCV: 72.5 fL — ABNORMAL LOW (ref 80.0–100.0)
Monocytes Absolute: 1.1 10*3/uL — ABNORMAL HIGH (ref 0.1–1.0)
Monocytes Relative: 10 %
Neutro Abs: 7.4 10*3/uL (ref 1.7–7.7)
Neutrophils Relative %: 64 %
Platelets: 452 10*3/uL — ABNORMAL HIGH (ref 150–400)
RBC: 4.47 MIL/uL (ref 3.87–5.11)
RDW: 23.1 % — ABNORMAL HIGH (ref 11.5–15.5)
WBC: 11.6 10*3/uL — ABNORMAL HIGH (ref 4.0–10.5)
nRBC: 0.3 % — ABNORMAL HIGH (ref 0.0–0.2)

## 2019-05-04 LAB — FERRITIN: Ferritin: 218 ng/mL (ref 11–307)

## 2019-05-04 LAB — D-DIMER, QUANTITATIVE: D-Dimer, Quant: 2.81 ug/mL-FEU — ABNORMAL HIGH (ref 0.00–0.50)

## 2019-05-04 MED ORDER — APIXABAN 5 MG PO TABS: ORAL_TABLET | ORAL | 1 refills | Status: DC

## 2019-05-04 MED ORDER — APIXABAN 5 MG PO TABS
10.0000 mg | ORAL_TABLET | Freq: Two times a day (BID) | ORAL | Status: DC
  Administered 2019-05-04: 10 mg via ORAL
  Filled 2019-05-04: qty 2

## 2019-05-04 MED ORDER — FERROUS SULFATE 325 (65 FE) MG PO TBEC: 325.0000 mg | DELAYED_RELEASE_TABLET | Freq: Three times a day (TID) | ORAL | 2 refills | Status: AC

## 2019-05-04 MED ORDER — APIXABAN 5 MG PO TABS: 5.0000 mg | ORAL_TABLET | Freq: Two times a day (BID) | ORAL | Status: DC

## 2019-05-04 MED ORDER — FERROUS SULFATE 325 (65 FE) MG PO TBEC: 325.0000 mg | DELAYED_RELEASE_TABLET | Freq: Three times a day (TID) | ORAL | 2 refills | Status: DC

## 2019-05-04 NOTE — Discharge Summary (Signed)
Physician Discharge Summary  Christine Spencer [id number] DOB: 14-Jul-1970 DOA: 05/01/2019  PCP: Patient, No Pcp Per  Admit date: 05/01/2019 Discharge date: 05/04/2019  Admitted From: Home Disposition: Home   Recommendations for Outpatient Follow-up:  1. Follow up with PCP in 1-2 weeks 2. Please obtain CBC within one week 3. Recommend OB/GYN and/or PCP follow up for management of menorrhagia.  Home Health: None Equipment/Devices: None Discharge Condition: Stable CODE STATUS: Full Diet recommendation: As tolerated  Brief/Interim Summary: Christine Spencer is a 49 y.o. female with a history of iron deficiency anemia, mild intermittent asthma, and menorrhagia who presented with progressive shortness of breath having recently completed a menstrual cycle. She'd also had a cough and had been found to be positive for covid-19 approximately 2 weeks prior after exposure at work. Evaluation in the ED revealed microcytic anemia with hemoglobin of 7.5g/dl (baseline 11-12) and low ferritin, UPT negative, FOBT negative, SARS-CoV-2 PCR positive, CRP 1.0. CXR without infiltrate and despite significant exertional dyspnea, she was not hypoxemic. She was given 1u PRBCs, remdesivir and transferred to [facility]. Steroids had been given but were stopped due to no hypoxemia. An additional unit of PRBCs and IV iron were given 2/4 and hemoglobin rebounded to 8.9, subsequently down to 8.5g/dl despite no active bleeding reported. Due to continued elevation of d-dimer in light of minimally elevated CRP, LDH, lower extremity venous U/S was performed revealing bilateral acute DVTs per preliminary report. Due need for therapeutic anticoagulation in the face of continued transfusion-dependent anemia, IV heparin is started and CBC will continue to be monitored.   Discharge Diagnoses:  Principal Problem:   Acute on chronic anemia Active Problems:   SOB (shortness of breath)   COVID-19 virus infection   Hypokalemia   Asthma    Anemia  Symptomatic acute blood loss anemia on chronic iron deficiency anemia: Due presumably to menorrhagia per pt history. Has had unrevealing colonoscopy and EGD, negative FOBT in ED, and no other reports of bleeding. nRBCs initially zero, now beginning to show up on CBC after iron infusion.  - Continue oral iron with bowel regimen - Needs close follow up of CBC whether with PCP or [facility] - s/p 2u PRBCs 2/4 with hgb 7.5 >> 9.3, s/p IV iron 2/4 - Will need close PCP follow up with labs within a week of discharge. CM consulted.   Menorrhagia:  - Needs close OB/GYN follow up and strong consideration of prn medication to stop menstruation (e.g. megace).  Covid-19 infection: At high risk of progression and remains tachypneic, dyspneic. SARS-CoV-2 Ag negative, PCR positive 2/3 - Continue remdesivir 2/4 - 2/8, arranged to complete course as outpatient in infusion clinic on 2/7 and 2/8 at 10am. - Stopped steroids with no hypoxemia or infiltrates.  - Continue isolation x21 days.   Acute bilateral lower extremity DVTs: In the right posterior tibial veins and left peroneal veins by U/S 2/5. No clinical evidence of pulmonary embolism, so will forego CTA chest. No bleeding or hgb decrease after initiating anticoagulation.  - Continue therapeutic anticoagulation x 3-6 months. After discussion of options, patient amenable to eliquis. Insurance is Nurse, mental health.   Mild intermittent asthma: No exacerbation - Continue prn albuterol  Obesity: BMI 44. Noted.  Thrombocytosis: Likely due to iron deficiency anemia and/or reactive, improving.  - Monitor at follow up.   Discharge Instructions Discharge Instructions    Diet general   Complete by: As directed    Discharge instructions   Complete by: As directed  You are being discharged from the hospital after treatment for covid-19 infection. You are felt to be stable enough to no longer require inpatient monitoring, testing, and  treatment, though you will need to follow the recommendations below: - Continue taking iron supplements. These will cause constipation and you should take a laxative to prevent that.  - Return to this hospital tomorrow morning for remdesivir.  - To treat the blood clots in your legs, continue taking eliquis (blood thinner/anticoagulant). You will take 2 tablets twice a day for a week then go down to 1 tablet twice a day thereafter for a total of 3-6 months. You must let your doctor know if you have any significant/uncontrolled bleeding - Call the [facility] on Monday to schedule a hospital follow up appointment with labs (complete blood count). - Per CDC guidelines, you will need to remain in isolation for 21 days from your first positive covid test. - Do not take NSAID medications (including, but not limited to, ibuprofen, advil, motrin, naproxen, aleve, goody's powder, etc.) - Follow up with your doctor in the next week via telehealth or seek medical attention right away if your symptoms get WORSE.  - Consider donating plasma after you have recovered (either 14 days after a negative test or 28 days after symptoms have completely resolved) because your antibodies to this virus may be helpful to give to others with life-threatening infections. Please go to the website www.[email or web address] if you would like to consider volunteering for plasma donation.    Directions for you at home:  Wear a facemask You should wear a facemask that covers your nose and mouth when you are in the same room with other people and when you visit a healthcare provider. People who live with or visit you should also wear a facemask while they are in the same room with you.  Separate yourself from other people in your home As much as possible, you should stay in a different room from other people in your home. Also, you should use a separate bathroom, if available.  Avoid sharing household items You  should not share dishes, drinking glasses, cups, eating utensils, towels, bedding, or other items with other people in your home. After using these items, you should wash them thoroughly with soap and water.  Cover your coughs and sneezes Cover your mouth and nose with a tissue when you cough or sneeze, or you can cough or sneeze into your sleeve. Throw used tissues in a lined trash can, and immediately wash your hands with soap and water for at least 20 seconds or use an alcohol-based hand rub.  Wash your [facility] your hands often and thoroughly with soap and water for at least 20 seconds. You can use an alcohol-based hand sanitizer if soap and water are not available and if your hands are not visibly dirty. Avoid touching your eyes, nose, and mouth with unwashed hands.  Directions for those who live with, or provide care at home for you:  Limit the number of people who have contact with the patient If possible, have only one caregiver for the patient. Other household members should stay in another home or place of residence. If this is not possible, they should stay in another room, or be separated from the patient as much as possible. Use a separate bathroom, if available. Restrict visitors who do not have an essential need to be in the home.  Ensure good ventilation Make sure that shared spaces  in the home have good air flow, such as from an air conditioner or an opened window, weather permitting.  Wash your hands often Wash your hands often and thoroughly with soap and water for at least 20 seconds. You can use an alcohol based hand sanitizer if soap and water are not available and if your hands are not visibly dirty. Avoid touching your eyes, nose, and mouth with unwashed hands. Use disposable paper towels to dry your hands. If not available, use dedicated cloth towels and replace them when they become wet.  Wear a facemask and gloves Wear a disposable facemask at all times in  the room and gloves when you touch or have contact with the patient's blood, body fluids, and/or secretions or excretions, such as sweat, saliva, sputum, nasal mucus, vomit, urine, or feces.  Ensure the mask fits over your nose and mouth tightly, and do not touch it during use. Throw out disposable facemasks and gloves after using them. Do not reuse. Wash your hands immediately after removing your facemask and gloves. If your personal clothing becomes contaminated, carefully remove clothing and launder. Wash your hands after handling contaminated clothing. Place all used disposable facemasks, gloves, and other waste in a lined container before disposing them with other household waste. Remove gloves and wash your hands immediately after handling these items.  Do not share dishes, glasses, or other household items with the patient Avoid sharing household items. You should not share dishes, drinking glasses, cups, eating utensils, towels, bedding, or other items with a patient who is confirmed to have, or being evaluated for, COVID-19 infection. After the person uses these items, you should wash them thoroughly with soap and water.  Wash laundry thoroughly Immediately remove and wash clothes or bedding that have blood, body fluids, and/or secretions or excretions, such as sweat, saliva, sputum, nasal mucus, vomit, urine, or feces, on them. Wear gloves when handling laundry from the patient. Read and follow directions on labels of laundry or clothing items and detergent. In general, wash and dry with the warmest temperatures recommended on the label.  Clean all areas the individual has used often Clean all touchable surfaces, such as counters, tabletops, doorknobs, bathroom fixtures, toilets, phones, keyboards, tablets, and bedside tables, every day. Also, clean any surfaces that may have blood, body fluids, and/or secretions or excretions on them. Wear gloves when cleaning surfaces the patient has  come in contact with. Use a diluted bleach solution (e.g., dilute bleach with 1 part bleach and 10 parts water) or a household disinfectant with a label that says EPA-registered for coronaviruses. To make a bleach solution at home, add 1 tablespoon of bleach to 1 quart (4 cups) of water. For a larger supply, add  cup of bleach to 1 gallon (16 cups) of water. Read labels of cleaning products and follow recommendations provided on product labels. Labels contain instructions for safe and effective use of the cleaning product including precautions you should take when applying the product, such as wearing gloves or eye protection and making sure you have good ventilation during use of the product. Remove gloves and wash hands immediately after cleaning.  Monitor yourself for signs and symptoms of illness Caregivers and household members are considered close contacts, should monitor their health, and will be asked to limit movement outside of the home to the extent possible. Follow the monitoring steps for close contacts listed on the symptom monitoring form.  If you have additional questions, contact your local health department or call  the epidemiologist on call at [phone] (available 24/7). This guidance is subject to change. For the most up-to-date guidance from [facility], please refer to their website: YouBlogs.pl   Increase activity slowly   Complete by: As directed    MyChart COVID-19 home monitoring program   Complete by: May 04, 2019    Is the patient willing to use the [facility] for home monitoring?: Yes     Allergies as of 05/04/2019      Reactions   Penicillins Swelling      Medication List    STOP taking these medications   ibuprofen 600 MG tablet Commonly known as: ADVIL     TAKE these medications   acetaminophen 325 MG tablet Commonly known as: TYLENOL Take 650 mg by mouth every 6 (six) hours as needed  for headache.   albuterol 108 (90 Base) MCG/ACT inhaler Commonly known as: VENTOLIN HFA Inhale 2 puffs into the lungs every 4 (four) hours as needed for wheezing.   apixaban 5 MG Tabs tablet Commonly known as: Eliquis Take 2 tablets (10mg ) twice daily for 7 days, then 1 tablet (5mg ) twice daily   aspirin-acetaminophen-caffeine 250-250-65 MG tablet Commonly known as: EXCEDRIN MIGRAINE Take 1 tablet by mouth every 6 (six) hours as needed for headache.   ferrous sulfate 325 (65 FE) MG EC tablet Take 1 tablet (325 mg total) by mouth 3 (three) times daily with meals. take with stool softener/laxative   multivitamin tablet Take 1 tablet by mouth daily.      Follow-up Information    [facility]. Call.   Why: callk Monday Feb 8 to make a hospital follow appt. Contact information: [place] [phone] [place], [facility]. Call.   Why: Call to arrange a new patient appointment Contact information: [place] [address] [phone]          Allergies  Allergen Reactions  . Penicillins Swelling    Consultations:  Pharmacy  Procedures/Studies: DG Chest Port 1 View  Result Date: 05/01/2019 CLINICAL DATA:  Chest pain, COVID-19 positive test 2 weeks ago, short of breath and cough EXAM: PORTABLE CHEST 1 VIEW COMPARISON:  04/30/2016 FINDINGS: Single frontal view of the chest demonstrates decreased lung volumes. There is accentuation of the central pulmonary vasculature and mild diffuse interstitial prominence, nonspecific. No airspace disease, effusion, or pneumothorax. IMPRESSION: 1. Low lung volumes with crowding of the central vasculature. 2. No acute airspace disease. Electronically Signed   By: Randa Ngo M.D.   On: 05/01/2019 16:22   VAS [place] LOWER EXTREMITY VENOUS (DVT)  Result Date: 05/03/2019  Lower Venous DVTStudy Indications: Elevated  Ddimer.  Risk Factors: COVID 19 positive. Comparison Study: No prior studies. Performing Technologist: Oliver Hum RVT  Examination Guidelines: A complete evaluation includes B-mode imaging, spectral Doppler, color Doppler, and power Doppler as needed of all accessible portions of each vessel. Bilateral testing is considered an integral part of a complete examination. Limited examinations for reoccurring indications may be performed as noted. The reflux portion of the exam is performed with the patient in reverse Trendelenburg.  +---------+---------------+---------+-----------+----------+--------------+ RIGHT    CompressibilityPhasicitySpontaneityPropertiesThrombus Aging +---------+---------------+---------+-----------+----------+--------------+ CFV      Full           Yes      Yes                                 +---------+---------------+---------+-----------+----------+--------------+  SFJ      Full                                                        +---------+---------------+---------+-----------+----------+--------------+ FV Prox  Full                                                        +---------+---------------+---------+-----------+----------+--------------+ FV Mid   Full                                                        +---------+---------------+---------+-----------+----------+--------------+ FV DistalFull                                                        +---------+---------------+---------+-----------+----------+--------------+ PFV      Full                                                        +---------+---------------+---------+-----------+----------+--------------+ POP      Full           Yes      Yes                                 +---------+---------------+---------+-----------+----------+--------------+ PTV      None                                         Acute           +---------+---------------+---------+-----------+----------+--------------+ PERO     Full                                                        +---------+---------------+---------+-----------+----------+--------------+   +---------+---------------+---------+-----------+----------+--------------+ LEFT     CompressibilityPhasicitySpontaneityPropertiesThrombus Aging +---------+---------------+---------+-----------+----------+--------------+ CFV      Full           Yes      Yes                                 +---------+---------------+---------+-----------+----------+--------------+ SFJ      Full                                                        +---------+---------------+---------+-----------+----------+--------------+  FV Prox  Full                                                        +---------+---------------+---------+-----------+----------+--------------+ FV Mid   Full                                                        +---------+---------------+---------+-----------+----------+--------------+ FV DistalFull                                                        +---------+---------------+---------+-----------+----------+--------------+ PFV      Full                                                        +---------+---------------+---------+-----------+----------+--------------+ POP      Full           Yes      Yes                                 +---------+---------------+---------+-----------+----------+--------------+ PTV      Full                                                        +---------+---------------+---------+-----------+----------+--------------+ PERO     Partial                                      Acute          +---------+---------------+---------+-----------+----------+--------------+     Summary: RIGHT: - Findings consistent with acute deep vein thrombosis involving the right posterior tibial veins. - No  cystic structure found in the popliteal fossa.  LEFT: - Findings consistent with acute deep vein thrombosis involving the left peroneal veins. - No cystic structure found in the popliteal fossa.  *See table(s) above for measurements and observations.    Preliminary    Subjective: Feels well, no more shortness of breath. No lightheadedness with ambulation, chest pain or palpitations. Denies any bleeding, had a normal BM. Some cough.  Discharge Exam: Vitals:   05/04/19 0400 05/04/19 0742  BP: 132/82 (!) 126/92  Pulse: 90 92  Resp: 20 18  Temp: 98.7 F (37.1 C) 98 F (36.7 C)  SpO2: 96% 94%   General: Pt is alert, awake, not in acute distress Cardiovascular: Regular, no JVD Respiratory: Nonlabored and clear on room air Abdominal: Soft, NT, ND, bowel sounds + Extremities: No edema, no cyanosis. Neg homan's  Labs: BNP (last 3 results) Recent Labs    05/01/19 1500  BNP 31.4  Basic Metabolic Panel: Recent Labs  Lab 05/01/19 1500 05/02/19 0316  NA 142 142  K 3.1* 3.9  CL 106 106  CO2 25 26  GLUCOSE 112* 110*  BUN 12 14  CREATININE 0.77 0.82  CALCIUM 10.6* 10.5*  MG 2.5* 2.5*   Liver Function Tests: Recent Labs  Lab 05/01/19 1500 05/02/19 0316  AST 21 20  ALT 15 15  ALKPHOS 44 42  BILITOT 1.3* 1.3*  PROT 8.4* 8.0  ALBUMIN 3.8 3.5   No results for input(s): LIPASE, AMYLASE in the last 168 hours. No results for input(s): AMMONIA in the last 168 hours. CBC: Recent Labs  Lab 05/01/19 1500 05/01/19 1500 05/02/19 0316 05/02/19 1415 05/03/19 0029 05/03/19 0444 05/04/19 0141  WBC 10.5  --  10.0  --  11.8*  --  11.6*  NEUTROABS 6.7  --  6.5  --  8.4*  --  7.4  HGB 7.5*   < > 8.3* 8.2* 8.9* 8.5* 9.3*  HCT 28.2*   < > 29.5* 28.9* 30.9* 29.3* 32.4*  MCV 68.3*  --  70.1*  --  72.5*  --  72.5*  PLT 602*  --  510*  --  490*  --  452*   < > = values in this interval not displayed.   Cardiac Enzymes: No results for input(s): CKTOTAL, CKMB, CKMBINDEX, TROPONINI in  the last 168 hours. BNP: Invalid input(s): POCBNP CBG: No results for input(s): GLUCAP in the last 168 hours. D-Dimer Recent Labs    05/03/19 0029 05/04/19 0141  DDIMER 2.91* 2.81*   Hgb A1c No results for input(s): HGBA1C in the last 72 hours. Lipid Profile Recent Labs    05/01/19 1500  TRIG 105   Thyroid function studies No results for input(s): TSH, T4TOTAL, T3FREE, THYROIDAB in the last 72 hours.  Invalid input(s): FREET3 Anemia work up [employer]    05/01/19 2058 05/01/19 2102 05/02/19 0316 05/03/19 0029 05/04/19 0141  FERRITIN  --   --    < > 19 218  TIBC  --  363  --   --   --   IRON  --  18*  --   --   --   RETICCTPCT 2.1  --   --   --   --    < > = values in this interval not displayed.   Urinalysis No results found for: COLORURINE, APPEARANCEUR, LABSPEC, [place], GLUCOSEU, [place], [place], KETONESUR, PROTEINUR, UROBILINOGEN, NITRITE, LEUKOCYTESUR  Microbiology Recent Results (from the past 240 hour(s))  SARS CORONAVIRUS 2 (TAT 6-24 HRS) Nasopharyngeal Nasopharyngeal Swab     Status: Abnormal   Collection Time: 05/01/19  7:59 PM   Specimen: Nasopharyngeal Swab  Result Value Ref Range Status   SARS Coronavirus 2 POSITIVE (A) NEGATIVE Final    Comment: RESULT CALLED TO, READ BACK BY AND VERIFIED WITH: RN Lukasz Rogus CHRISTIAN  AT 0427 BY MESSAN H. ON 05/02/2019 (NOTE) SARS-CoV-2 target nucleic acids are DETECTED. The SARS-CoV-2 RNA is generally detectable in upper and lower respiratory specimens during the acute phase of infection. Positive results are indicative of the presence of SARS-CoV-2 RNA. Clinical correlation with patient history and other diagnostic information is  necessary to determine patient infection status. Positive results do not rule out bacterial infection or co-infection with other viruses.  The expected result is Negative. Fact Sheet for Patients: SugarRoll.be Fact Sheet for Healthcare  Providers: https://www.woods-mathews.com/ This test is not yet approved or cleared by the [place] FDA and  has been  authorized for detection and/or diagnosis of SARS-CoV-2 by FDA under an Emergency Use Authorization (EUA). This EUA will remain  in effect (meaning this test can  be used) for the duration of the COVID-19 declaration under Section 564(b)(1) of the Act, 21 U.S.C. section 360bbb-3(b)(1), unless the authorization is terminated or revoked sooner. Performed at [facility], [place] [address] [place], [place] [address]     Time coordinating discharge: Approximately 40 minutes  Patrecia Pour, MD  [facility] 05/04/2019, 9:47 AM

## 2019-05-04 NOTE — Progress Notes (Signed)
Patient scheduled for outpatient Remdesivir infusion at 10:00 am on Sunday 2/7 and Monday 2/8.    Please advise them to report to [facility] at [address].  Drive to the security guard and tell them you are here for an infusion. They will direct you to the front entrance where we will come and get you.  For questions call [phone].  Thanks

## 2019-05-04 NOTE — Plan of Care (Signed)

## 2019-05-04 NOTE — Discharge Instructions (Addendum)
You are scheduled for an outpatient infusion of Remdesivir at 10:00 am on Sunday 2/7 and Monday 2/8.   Please report to [facility] at [address]  Drive to the security guard and tell them you are here for an infusion. They will direct you to the front entrance where we will come and get you.  For questions call [phone].  Thanks     COVID-19 COVID-19 is a respiratory infection that is caused by a virus called severe acute respiratory syndrome coronavirus 2 (SARS-CoV-2). The disease is also known as coronavirus disease or novel coronavirus. In some people, the virus may not cause any symptoms. In others, it may cause a serious infection. The infection can get worse quickly and can lead to complications, such as:  Pneumonia, or infection of the lungs.  Acute respiratory distress syndrome or ARDS. This is a condition in which fluid build-up in the lungs prevents the lungs from filling with air and passing oxygen into the blood.  Acute respiratory failure. This is a condition in which there is not enough oxygen passing from the lungs to the body or when carbon dioxide is not passing from the lungs out of the body.  Sepsis or septic shock. This is a serious bodily reaction to an infection.  Blood clotting problems.  Secondary infections due to bacteria or fungus.  Organ failure. This is when your body's organs stop working. The virus that causes COVID-19 is contagious. This means that it can spread from person to person through droplets from coughs and sneezes (respiratory secretions). What are the causes? This illness is caused by a virus. You may catch the virus by:  Breathing in droplets from an infected person. Droplets can be spread by a person breathing, speaking, singing, coughing, or sneezing.  Touching something, like a table or a doorknob, that was exposed to the virus (contaminated) and then touching your mouth, nose, or eyes. What increases the risk? Risk for  infection You are more likely to be infected with this virus if you:  Are within 6 feet (2 meters) of a person with COVID-19.  Provide care for or live with a person who is infected with COVID-19.  Spend time in crowded indoor spaces or live in shared housing. Risk for serious illness You are more likely to become seriously ill from the virus if you:  Are 73 years of age or older. The higher your age, the more you are at risk for serious illness.  Live in a nursing home or long-term care facility.  Have cancer.  Have a long-term (chronic) disease such as: ? Chronic lung disease, including chronic obstructive pulmonary disease or asthma. ? A long-term disease that lowers your body's ability to fight infection (immunocompromised). ? Heart disease, including heart failure, a condition in which the arteries that lead to the heart become narrow or blocked (coronary artery disease), a disease which makes the heart muscle thick, weak, or stiff (cardiomyopathy). ? Diabetes. ? Chronic kidney disease. ? Sickle cell disease, a condition in which red blood cells have an abnormal "sickle" shape. ? Liver disease.  Are obese. What are the signs or symptoms? Symptoms of this condition can range from mild to severe. Symptoms may appear any time from 2 to 14 days after being exposed to the virus. They include:  A fever or chills.  A cough.  Difficulty breathing.  Headaches, body aches, or muscle aches.  Runny or stuffy (congested) nose.  A sore throat.  New loss of taste or smell. Some people may also have stomach problems, such as nausea, vomiting, or diarrhea. Other people may not have any symptoms of COVID-19. How is this diagnosed? This condition may be diagnosed based on:  Your signs and symptoms, especially if: ? You live in an area with a COVID-19 outbreak. ? You recently traveled to or from an area where the virus is common. ? You provide care for or live with a person who  was diagnosed with COVID-19. ? You were exposed to a person who was diagnosed with COVID-19.  A physical exam.  Lab tests, which may include: ? Taking a sample of fluid from the back of your nose and throat (nasopharyngeal fluid), your nose, or your throat using a swab. ? A sample of mucus from your lungs (sputum). ? Blood tests.  Imaging tests, which may include, X-rays, CT scan, or ultrasound. How is this treated? At present, there is no medicine to treat COVID-19. Medicines that treat other diseases are being used on a trial basis to see if they are effective against COVID-19. Your health care provider will talk with you about ways to treat your symptoms. For most people, the infection is mild and can be managed at home with rest, fluids, and over-the-counter medicines. Treatment for a serious infection usually takes places in a hospital intensive care unit (ICU). It may include one or more of the following treatments. These treatments are given until your symptoms improve.  Receiving fluids and medicines through an IV.  Supplemental oxygen. Extra oxygen is given through a tube in the nose, a face mask, or a hood.  Positioning you to lie on your stomach (prone position). This makes it easier for oxygen to get into the lungs.  Continuous positive airway pressure (CPAP) or bi-level positive airway pressure (BPAP) machine. This treatment uses mild air pressure to keep the airways open. A tube that is connected to a motor delivers oxygen to the body.  Ventilator. This treatment moves air into and out of the lungs by using a tube that is placed in your windpipe.  Tracheostomy. This is a procedure to create a hole in the neck so that a breathing tube can be inserted.  Extracorporeal membrane oxygenation (ECMO). This procedure gives the lungs a chance to recover by taking over the functions of the heart and lungs. It supplies oxygen to the body and removes carbon dioxide. Follow these  instructions at home: Lifestyle  If you are sick, stay home except to get medical care. Your health care provider will tell you how long to stay home. Call your health care provider before you go for medical care.  Rest at home as told by your health care provider.  Do not use any products that contain nicotine or tobacco, such as cigarettes, e-cigarettes, and chewing tobacco. If you need help quitting, ask your health care provider.  Return to your normal activities as told by your health care provider. Ask your health care provider what activities are safe for you. General instructions  Take over-the-counter and prescription medicines only as told by your health care provider.  Drink enough fluid to keep your urine pale yellow.  Keep all follow-up visits as told by your health care provider. This is important. How is this prevented?  There is no vaccine to help prevent COVID-19 infection. However, there are steps you can take to protect yourself and others from this virus. To protect yourself:   Do not travel to  areas where COVID-19 is a risk. The areas where COVID-19 is reported change often. To identify high-risk areas and travel restrictions, check the CDC travel website: FatFares.com.br  If you live in, or must travel to, an area where COVID-19 is a risk, take precautions to avoid infection. ? Stay away from people who are sick. ? Wash your hands often with soap and water for 20 seconds. If soap and water are not available, use an alcohol-based hand sanitizer. ? Avoid touching your mouth, face, eyes, or nose. ? Avoid going out in public, follow guidance from your state and local health authorities. ? If you must go out in public, wear a cloth face covering or face mask. Make sure your mask covers your nose and mouth. ? Avoid crowded indoor spaces. Stay at least 6 feet (2 meters) away from others. ? Disinfect objects and surfaces that are frequently touched every day.  This may include:  Counters and tables.  Doorknobs and light switches.  Sinks and faucets.  Electronics, such as phones, remote controls, keyboards, computers, and tablets. To protect others: If you have symptoms of COVID-19, take steps to prevent the virus from spreading to others.  If you think you have a COVID-19 infection, contact your health care provider right away. Tell your health care team that you think you may have a COVID-19 infection.  Stay home. Leave your house only to seek medical care. Do not use public transport.  Do not travel while you are sick.  Wash your hands often with soap and water for 20 seconds. If soap and water are not available, use alcohol-based hand sanitizer.  Stay away from other members of your household. Let healthy household members care for children and pets, if possible. If you have to care for children or pets, wash your hands often and wear a mask. If possible, stay in your own room, separate from others. Use a different bathroom.  Make sure that all people in your household wash their hands well and often.  Cough or sneeze into a tissue or your sleeve or elbow. Do not cough or sneeze into your hand or into the air.  Wear a cloth face covering or face mask. Make sure your mask covers your nose and mouth. Where to find more information  Centers for Disease Control and Prevention: PurpleGadgets.be  World Health Organization: https://www.castaneda.info/ Contact a health care provider if:  You live in or have traveled to an area where COVID-19 is a risk and you have symptoms of the infection.  You have had contact with someone who has COVID-19 and you have symptoms of the infection. Get help right away if:  You have trouble breathing.  You have pain or pressure in your chest.  You have confusion.  You have bluish lips and fingernails.  You have difficulty waking from sleep.  You have symptoms  that get worse. These symptoms may represent a serious problem that is an emergency. Do not wait to see if the symptoms will go away. Get medical help right away. Call your local emergency services (911 in the [place] Do not drive yourself to the hospital. Let the emergency medical personnel know if you think you have COVID-19. Summary  COVID-19 is a respiratory infection that is caused by a virus. It is also known as coronavirus disease or novel coronavirus. It can cause serious infections, such as pneumonia, acute respiratory distress syndrome, acute respiratory failure, or sepsis.  The virus that causes COVID-19 is contagious. This means  that it can spread from person to person through droplets from breathing, speaking, singing, coughing, or sneezing.  You are more likely to develop a serious illness if you are 71 years of age or older, have a weak immune system, live in a nursing home, or have chronic disease.  There is no medicine to treat COVID-19. Your health care provider will talk with you about ways to treat your symptoms.  Take steps to protect yourself and others from infection. Wash your hands often and disinfect objects and surfaces that are frequently touched every day. Stay away from people who are sick and wear a mask if you are sick. This information is not intended to replace advice given to you by your health care provider. Make sure you discuss any questions you have with your health care provider. Document Revised: 01/11/2019 Document Reviewed: 04/19/2018 Elsevier Patient Education  [place] on my medicine - ELIQUIS (apixaban)  This medication education was reviewed with me or my healthcare representative as part of my discharge preparation.  The pharmacist that spoke with me during my hospital stay was:  Lavenia Atlas, [facility]  Why was Eliquis prescribed for you? Eliquis was prescribed to treat blood clots that may have been found in the  veins of your legs (deep vein thrombosis) or in your lungs (pulmonary embolism) and to reduce the risk of them occurring again.  What do You need to know about Eliquis ? The starting dose is 10 mg (two 5 mg tablets) taken TWICE daily for the FIRST SEVEN (7) DAYS, then on (enter date)  05/11/2019  the dose is reduced to ONE 5 mg tablet taken TWICE daily.  Eliquis may be taken with or without food.   Try to take the dose about the same time in the morning and in the evening. If you have difficulty swallowing the tablet whole please discuss with your pharmacist how to take the medication safely.  Take Eliquis exactly as prescribed and DO NOT stop taking Eliquis without talking to the doctor who prescribed the medication.  Stopping may increase your risk of developing a new blood clot.  Refill your prescription before you run out.  After discharge, you should have regular check-up appointments with your healthcare provider that is prescribing your Eliquis.    What do you do if you miss a dose? If a dose of ELIQUIS is not taken at the scheduled time, take it as soon as possible on the same day and twice-daily administration should be resumed. The dose should not be doubled to make up for a missed dose.  Important Safety Information A possible side effect of Eliquis is bleeding. You should call your healthcare provider right away if you experience any of the following: ? Bleeding from an injury or your nose that does not stop. ? Unusual colored urine (red or dark brown) or unusual colored stools (red or black). ? Unusual bruising for unknown reasons. ? A serious fall or if you hit your head (even if there is no bleeding).  Some medicines may interact with Eliquis and might increase your risk of bleeding or clotting while on Eliquis. To help avoid this, consult your healthcare provider or pharmacist prior to using any new prescription or non-prescription medications, including herbals,  vitamins, non-steroidal anti-inflammatory drugs (NSAIDs) and supplements.  This website has more information on Eliquis (apixaban): http://www.eliquis.com/eliquis/home

## 2019-05-04 NOTE — Progress Notes (Signed)
ANTICOAGULATION CONSULT NOTE - Follow Up Consult  Pharmacy Consult for Heparin > apixaban  Indication: DVT  Allergies  Allergen Reactions  . Penicillins Swelling    Patient Measurements: Height: 5\' 1"  (154.9 cm) Weight: 236 lb 12.4 oz (107.4 kg)(standing scale) IBW/kg (Calculated) : 47.8 Heparin Dosing Weight: 74 kg  Vital Signs: Temp: 98 F (36.7 C) (02/06 0742) Temp Source: Oral (02/06 0742) BP: 126/92 (02/06 0742) Pulse Rate: 92 (02/06 0742)  Labs: Recent Labs    05/01/19 1500 05/01/19 1500 05/01/19 1729 05/02/19 0316 05/02/19 1415 05/03/19 0029 05/03/19 0029 05/03/19 0444 05/03/19 1240 05/03/19 1950 05/04/19 0141 05/04/19 0635  HGB 7.5*   < >  --  8.3*   < > 8.9*   < > 8.5*  --   --  9.3*  --   HCT 28.2*   < >  --  29.5*   < > 30.9*  --  29.3*  --   --  32.4*  --   PLT 602*   < >  --  510*  --  490*  --   --   --   --  452*  --   APTT  --   --   --   --   --   --   --   --  38*  --   --   --   LABPROT 13.9  --   --  14.9  --   --   --   --   --   --   --   --   INR 1.1  --   --  1.2  --   --   --   --   --   --   --   --   HEPARINUNFRC  --   --   --   --   --   --   --   --   --  <0.10*  --  <0.10*  CREATININE 0.77  --   --  0.82  --   --   --   --   --   --   --   --   TROPONINIHS <2  --  <2.00  --   --   --   --   --   --   --   --   --    < > = values in this interval not displayed.    Estimated Creatinine Clearance: 93.8 mL/min (by C-G formula based on SCr of 0.82 mg/dL).   Medical History: Past Medical History:  Diagnosis Date  . Asthma   . Bronchitis   . Morbid obesity (HCC)     Medications:  Scheduled:  . vitamin C  500 mg Oral Daily  . ferrous sulfate  325 mg Oral Q breakfast  . multivitamin with minerals  1 tablet Oral Daily  . sodium chloride flush  3 mL Intravenous Q12H  . zinc sulfate  220 mg Oral Daily    Assessment: 49 y/o F with a h/o chronic iron deficiency anemia and menorrhagia admitted with anemia and COVID-19 infection.  Patient has been transfused and received feraheme.  LE dopplers from 2/5 showed bilateral DVT's. She is currently on IV heparin but pharmacy consulted to transition to apixaban. Hgb improved post transfusion. Will need to monitor closely for further bleeding on apixaban. SCr wnl.   Goal of Therapy:  DVT treatment  Monitor platelets by anticoagulation protocol: Yes   Plan:  -Stop IV  heparin -Start apixaban 10 mg twice daily x 7 day, then decreased dose to 5 mg twice daily  -Monitor closely for bleeding  Albertina Parr, PharmD., BCPS Clinical Pharmacist Clinical phone for 05/04/19 until 5pm: [phone]

## 2019-05-05 ENCOUNTER — Ambulatory Visit (HOSPITAL_COMMUNITY)
Admission: RE | Admit: 2019-05-05 | Discharge: 2019-05-05 | Disposition: A | Discharge status: Home / Self Care | Payer: BLUE CROSS/BLUE SHIELD | Source: Ambulatory Visit | Attending: Pulmonary Disease | Admitting: Pulmonary Disease

## 2019-05-05 DIAGNOSIS — J1282 Pneumonia due to coronavirus disease 2019: Secondary | ICD-10-CM | POA: Insufficient documentation

## 2019-05-05 DIAGNOSIS — U071 COVID-19: Secondary | ICD-10-CM | POA: Insufficient documentation

## 2019-05-05 MED ORDER — ALBUTEROL SULFATE HFA 108 (90 BASE) MCG/ACT IN AERS: 2.0000 | INHALATION_SPRAY | Freq: Once | RESPIRATORY_TRACT | Status: DC | PRN

## 2019-05-05 MED ORDER — DIPHENHYDRAMINE HCL 50 MG/ML IJ SOLN: 50.0000 mg | Freq: Once | INTRAMUSCULAR | Status: DC | PRN

## 2019-05-05 MED ORDER — EPINEPHRINE 0.3 MG/0.3ML IJ SOAJ: 0.3000 mg | Freq: Once | INTRAMUSCULAR | Status: DC | PRN

## 2019-05-05 MED ORDER — FAMOTIDINE IN NACL 20-0.9 MG/50ML-% IV SOLN: 20.0000 mg | Freq: Once | INTRAVENOUS | Status: DC | PRN

## 2019-05-05 MED ORDER — SODIUM CHLORIDE 0.9 % IV SOLN
100.0000 mg | Freq: Once | INTRAVENOUS | Status: AC
  Administered 2019-05-05: 100 mg via INTRAVENOUS
  Filled 2019-05-05: qty 20

## 2019-05-05 MED ORDER — SODIUM CHLORIDE 0.9 % IV SOLN: INTRAVENOUS | Status: DC | PRN

## 2019-05-05 MED ORDER — METHYLPREDNISOLONE SODIUM SUCC 125 MG IJ SOLR: 125.0000 mg | Freq: Once | INTRAMUSCULAR | Status: DC | PRN

## 2019-05-05 NOTE — Progress Notes (Signed)
  Diagnosis: COVID-19  Physician: Dr. Wright  Procedure: Covid Infusion Clinic Med: remdesivir infusion.  Complications: No immediate complications noted.  Discharge: Discharged home   Marveen Donlon 05/05/2019  

## 2019-05-06 ENCOUNTER — Ambulatory Visit (HOSPITAL_COMMUNITY)
Admit: 2019-05-06 | Discharge: 2019-05-06 | Disposition: A | Discharge status: Home / Self Care | Payer: BLUE CROSS/BLUE SHIELD | Attending: Pulmonary Disease | Admitting: Pulmonary Disease

## 2019-05-06 DIAGNOSIS — U071 COVID-19: Secondary | ICD-10-CM | POA: Diagnosis not present

## 2019-05-06 MED ORDER — FAMOTIDINE IN NACL 20-0.9 MG/50ML-% IV SOLN: 20.0000 mg | Freq: Once | INTRAVENOUS | Status: DC | PRN

## 2019-05-06 MED ORDER — DIPHENHYDRAMINE HCL 50 MG/ML IJ SOLN: 50.0000 mg | Freq: Once | INTRAMUSCULAR | Status: DC | PRN

## 2019-05-06 MED ORDER — SODIUM CHLORIDE 0.9 % IV SOLN: INTRAVENOUS | Status: DC | PRN

## 2019-05-06 MED ORDER — ALBUTEROL SULFATE HFA 108 (90 BASE) MCG/ACT IN AERS: 2.0000 | INHALATION_SPRAY | Freq: Once | RESPIRATORY_TRACT | Status: DC | PRN

## 2019-05-06 MED ORDER — EPINEPHRINE 0.3 MG/0.3ML IJ SOAJ: 0.3000 mg | Freq: Once | INTRAMUSCULAR | Status: DC | PRN

## 2019-05-06 MED ORDER — SODIUM CHLORIDE 0.9 % IV SOLN
100.0000 mg | Freq: Once | INTRAVENOUS | Status: AC
  Administered 2019-05-06: 11:00:00 100 mg via INTRAVENOUS
  Filled 2019-05-06: qty 20

## 2019-05-06 MED ORDER — METHYLPREDNISOLONE SODIUM SUCC 125 MG IJ SOLR: 125.0000 mg | Freq: Once | INTRAMUSCULAR | Status: DC | PRN

## 2019-05-06 NOTE — Progress Notes (Signed)
  Diagnosis: COVID-19  Physician: Dr. Joya Gaskins  Procedure: Covid Infusion Clinic Med: remdesivir infusion.  Complications: No immediate complications noted.  Discharge: Discharged home   [place], [place] 05/06/2019

## 2019-05-14 ENCOUNTER — Ambulatory Visit: Payer: BLUE CROSS/BLUE SHIELD | Attending: Internal Medicine | Admitting: Internal Medicine

## 2019-05-14 ENCOUNTER — Encounter: Payer: Self-pay | Admitting: Internal Medicine

## 2019-05-14 ENCOUNTER — Other Ambulatory Visit: Payer: Self-pay

## 2019-05-14 DIAGNOSIS — I824Z3 Acute embolism and thrombosis of unspecified deep veins of distal lower extremity, bilateral: Secondary | ICD-10-CM | POA: Diagnosis not present

## 2019-05-14 DIAGNOSIS — D500 Iron deficiency anemia secondary to blood loss (chronic): Secondary | ICD-10-CM | POA: Diagnosis not present

## 2019-05-14 DIAGNOSIS — Z09 Encounter for follow-up examination after completed treatment for conditions other than malignant neoplasm: Secondary | ICD-10-CM | POA: Diagnosis not present

## 2019-05-14 DIAGNOSIS — N920 Excessive and frequent menstruation with regular cycle: Secondary | ICD-10-CM

## 2019-05-14 DIAGNOSIS — U071 COVID-19: Secondary | ICD-10-CM

## 2019-05-14 NOTE — Progress Notes (Signed)
Virtual Visit via Telephone Note Due to current restrictions/limitations of in-office visits due to the COVID-19 pandemic, this scheduled clinical appointment was converted to a telehealth visit  I connected with Christine Spencer on 05/14/19 at 9:01 a.m by telephone and verified that I am speaking with the correct person using two identifiers. I am in my office.  The patient is at home.  Only the patient and myself participated in this encounter.  I discussed the limitations, risks, security and privacy concerns of performing an evaluation and management service by telephone and the availability of in person appointments. I also discussed with the patient that there may be a patient responsible charge related to this service. The patient expressed understanding and agreed to proceed.   History of Present Illness: Patient with history of IDA, menorrhagia, mild intermittent asthma, bilateral DVT DX 04/2019, obesity. This is a new patient visit and hospital follow-up. Previous PCP was Dr. Lorne Skeens with [facility] to change because she felt she was not receiving good care  Patient hospitalized 2/3-08/2019 with shortness of breath due to combination of symptomatic anemia and Covid 19 infection.  Patient presented with hemoglobin of 7.5 and history of being diagnosed with Covid 2 weeks prior.  She was transfused 2 units of packed red blood cells, IV iron and treated with remdesivir and steroids.  Hospital course complicated with bilateral DVTs.  Patient started on Eliquis.    COVID-19 infection: Patient completed two remdesivir infusions as an outpatient.  Feeling much better.  Occasional cough.  Using incentive spirometry. Wants to return to work.  She is a Print production planner.  Tested positive on 04/16/2019. Tested pos 05/01/2019 while in hosp.  IDA:  Dx about 1 yr ago.  Had c-scope and EDG 2019 through [facility] and both negative per pt.  Had neg FOBT on recent hosp -menses can be heavy at times.   Last 5-6 days - light first two days, then heavy for 2 days then light again. Bad cramps 2 days before menses start. -taking iron TID.  DVT:  No swelling or pain in the legs at this time.  Taking Eliquis and tolerating.  No previous clots.  No fhx of clots  Soc:  Does not drink, smoke or use street drugs Family, social history, past surgical histories reviewed.  Outpatient Encounter Medications as of 05/14/2019  Medication Sig  . albuterol (VENTOLIN HFA) 108 (90 Base) MCG/ACT inhaler Inhale 2 puffs into the lungs every 4 (four) hours as needed for wheezing.  Marland Kitchen apixaban (ELIQUIS) 5 MG TABS tablet Take 2 tablets (10mg ) twice daily for 7 days, then 1 tablet (5mg ) twice daily  . ferrous sulfate 325 (65 FE) MG EC tablet Take 1 tablet (325 mg total) by mouth 3 (three) times daily with meals. take with stool softener/laxative  . acetaminophen (TYLENOL) 325 MG tablet Take 650 mg by mouth every 6 (six) hours as needed for headache.  Marland Kitchen aspirin-acetaminophen-caffeine (EXCEDRIN MIGRAINE) 250-250-65 MG tablet Take 1 tablet by mouth every 6 (six) hours as needed for headache.  . Multiple Vitamin (MULTIVITAMIN) tablet Take 1 tablet by mouth daily.   No facility-administered encounter medications on file as of 05/14/2019.    Observations/Objective: Lab Results  Component Value Date   WBC 11.6 (H) 05/04/2019   HGB 9.3 (L) 05/04/2019   HCT 32.4 (L) 05/04/2019   MCV 72.5 (L) 05/04/2019   PLT 452 (H) 05/04/2019     Chemistry      Component Value Date/Time  NA 142 05/02/2019 0316   K 3.9 05/02/2019 0316   CL 106 05/02/2019 0316   CO2 26 05/02/2019 0316   BUN 14 05/02/2019 0316   CREATININE 0.82 05/02/2019 0316      Component Value Date/Time   CALCIUM 10.5 (H) 05/02/2019 0316   ALKPHOS 42 05/02/2019 0316   AST 20 05/02/2019 0316   ALT 15 05/02/2019 0316   BILITOT 1.3 (H) 05/02/2019 0316     Lab Results  Component Value Date   IRON 18 (L) 05/01/2019   TIBC 363 05/01/2019   FERRITIN 218  05/04/2019     Assessment and Plan: 1. Hospital discharge follow-up 2. COVID-19 virus infection Clinically recovered. Encouraged her to practice social distancing, wearing a mask and frequent handwashing. I think we can have her return to work the beginning of next week which will be the 22nd.  3. Iron deficiency anemia due to chronic blood loss Continue iron supplement.  I will have her go to the Respiratory clinic site on [address] to get CBC drawn - CBC; Future  4. Menorrhagia with regular cycle - US Pelvis Complete; Future - Ambulatory referral to Gynecology - CBC; Future  5. DVT, lower extremity, distal, acute, bilateral ([place]) Plan will be to continue Eliquis for 3 to 6 months.  Advised patient to report any bruising or bleeding including epistaxis hematuria rectal bleeding while on the medication.   Follow Up Instructions:  6 wks in person   I discussed the assessment and treatment plan with the patient. The patient was provided an opportunity to ask questions and all were answered. The patient agreed with the plan and demonstrated an understanding of the instructions.   The patient was advised to call back or seek an in-person evaluation if the symptoms worsen or if the condition fails to improve as anticipated.  I provided 22 minutes of non-face-to-face time during this encounter.   Karle Plumber, MD

## 2019-05-20 ENCOUNTER — Other Ambulatory Visit: Payer: BLUE CROSS/BLUE SHIELD

## 2019-05-20 DIAGNOSIS — D500 Iron deficiency anemia secondary to blood loss (chronic): Secondary | ICD-10-CM

## 2019-05-20 DIAGNOSIS — N920 Excessive and frequent menstruation with regular cycle: Secondary | ICD-10-CM

## 2019-05-21 ENCOUNTER — Other Ambulatory Visit: Payer: Self-pay | Admitting: Internal Medicine

## 2019-05-21 DIAGNOSIS — D500 Iron deficiency anemia secondary to blood loss (chronic): Secondary | ICD-10-CM

## 2019-05-21 LAB — SPECIMEN STATUS REPORT

## 2019-05-22 LAB — CBC
Hematocrit: 35.8 % (ref 34.0–46.6)
Hemoglobin: 11 g/dL — ABNORMAL LOW (ref 11.1–15.9)
MCH: 23.2 pg — ABNORMAL LOW (ref 26.6–33.0)
MCHC: 30.7 g/dL — ABNORMAL LOW (ref 31.5–35.7)
MCV: 75 fL — ABNORMAL LOW (ref 79–97)
Platelets: 349 10*3/uL (ref 150–450)
RBC: 4.75 x10E6/uL (ref 3.77–5.28)
RDW: 26.5 % — ABNORMAL HIGH (ref 11.7–15.4)
WBC: 13.6 10*3/uL — ABNORMAL HIGH (ref 3.4–10.8)

## 2019-05-22 LAB — SPECIMEN STATUS REPORT

## 2019-05-23 ENCOUNTER — Telehealth: Payer: Self-pay

## 2019-05-23 NOTE — Telephone Encounter (Signed)
Contacted pt to go over lab results pt didn't answer left a detailed vm informing pt of results and if she has any questions or concerns to give me a call  

## 2019-05-30 ENCOUNTER — Other Ambulatory Visit: Payer: Self-pay | Admitting: Internal Medicine

## 2019-05-30 ENCOUNTER — Other Ambulatory Visit: Payer: Self-pay

## 2019-05-30 ENCOUNTER — Ambulatory Visit (HOSPITAL_COMMUNITY)
Admission: RE | Admit: 2019-05-30 | Discharge: 2019-05-30 | Disposition: A | Discharge status: Home / Self Care | Payer: BLUE CROSS/BLUE SHIELD | Source: Ambulatory Visit | Attending: Internal Medicine | Admitting: Internal Medicine

## 2019-05-30 DIAGNOSIS — N920 Excessive and frequent menstruation with regular cycle: Secondary | ICD-10-CM | POA: Diagnosis not present

## 2019-05-31 ENCOUNTER — Telehealth: Payer: Self-pay | Admitting: General Practice

## 2019-05-31 NOTE — Telephone Encounter (Signed)
Patient called and requested for a call from pcp nurse or pcp regarding the patient getting the covid vaccine. Please follow up at your earliest convenience.

## 2019-05-31 NOTE — Telephone Encounter (Signed)
Will forward to pcp

## 2019-06-01 ENCOUNTER — Telehealth: Payer: Self-pay | Admitting: Internal Medicine

## 2019-06-01 NOTE — Telephone Encounter (Signed)
-----   Message from Ena Dawley sent at 05/31/2019 10:21 AM EST ----- Good Morning  Dr Luevenia Maxin in [facility]  contacted patient today at 8:35am   Per conversation with patient, she will call us back  ----- Message ----- From: Ladell Pier, MD Sent: 05/30/2019   9:43 PM EST To: Ena Dawley  Any appt with GYN as yet?

## 2019-06-03 NOTE — Telephone Encounter (Signed)
Contacted pt to go over Dr. Wynetta Emery response and to go over [place] results pt didn't answer left a detailed vm informing pt and if she has any questions or concerns to give me a call

## 2019-06-07 NOTE — Telephone Encounter (Signed)
Patient called and requested for a call from the referral coordinator regarding where she is being sent for gynecology. Patient requested address and telephone number. Please follow up at your earliest convenience.

## 2019-06-10 NOTE — Telephone Encounter (Signed)
Gm  I Lvm to patient her referral was sent to [facility]  ph# [phone] Thank you .

## 2019-07-01 ENCOUNTER — Encounter: Payer: Self-pay | Admitting: Internal Medicine

## 2019-07-01 ENCOUNTER — Ambulatory Visit: Payer: BLUE CROSS/BLUE SHIELD | Attending: Internal Medicine | Admitting: Internal Medicine

## 2019-07-01 ENCOUNTER — Other Ambulatory Visit: Payer: Self-pay

## 2019-07-01 VITALS — BP 119/77 | HR 77 | Temp 99.1°F | Resp 16 | Ht 61.0 in | Wt 250.2 lb

## 2019-07-01 DIAGNOSIS — I824Z3 Acute embolism and thrombosis of unspecified deep veins of distal lower extremity, bilateral: Secondary | ICD-10-CM | POA: Diagnosis not present

## 2019-07-01 DIAGNOSIS — Z6841 Body Mass Index (BMI) 40.0 and over, adult: Secondary | ICD-10-CM

## 2019-07-01 DIAGNOSIS — D500 Iron deficiency anemia secondary to blood loss (chronic): Secondary | ICD-10-CM | POA: Diagnosis not present

## 2019-07-01 DIAGNOSIS — N920 Excessive and frequent menstruation with regular cycle: Secondary | ICD-10-CM

## 2019-07-01 NOTE — Patient Instructions (Signed)

## 2019-07-01 NOTE — Progress Notes (Signed)
Patient ID: Christine Spencer, female    DOB: [birth year]  MRN: [id number]  CC: 6 weeks follow-up for chronic disease management.  Subjective: Christine Spencer is a 49 y.o. female who presents for chronic disease management Her concerns today include:  Patient with history of IDA, menorrhagia, mild intermittent asthma, bilateral DVT DX 04/2019 in setting of COVID, obesity.  Menorrhagia/IDA:  Has appt with GYN 07/04/2019.  Currently on menses.  Menses always heavy where she has to wear Depends during cycles. Heavy even before and no worse since being on Eliquis. Prior to 3 years ago menses were irregular occurring about Q 3-4 mths.  Pelvic ultrasound reveals uterine leiomyoma -still taking iron supplement TID  DVT BL LE: no swelling in legs. No bruising or bleeding besides menses since being on Eliquis x 2 mths.   Obesity:  She was at 270 lbs about 1 yr ago.  Got down to 230 lbs. Feels stress played apart in wgh gain.  Use to work out on [employer] but has not done anything since being dx with COVID and blood clot.  Wants to know if it is okay for her to restart exercise program.  Feels her breathing is better.  -Does okay with eating habits. Drinks mainly water.  Meats - chicken and sea food. Pasta occasionally Never saw a nutritionist in past.  Previous PCP gave her diet pills but pt felt the med caused wgh gain than loss.  She does not recall the name of the med.  Patient Active Problem List   Diagnosis Date Noted  . Class 3 severe obesity due to excess calories without serious comorbidity with body mass index (BMI) of 45.0 to 49.9 in adult ([place]) 07/01/2019  . Menorrhagia with regular cycle 05/14/2019  . SOB (shortness of breath) 05/02/2019  . COVID-19 virus infection 05/02/2019  . Hypokalemia 05/02/2019  . Anemia 05/02/2019  . Asthma   . Acute on chronic anemia 05/01/2019     Current Outpatient Medications on File Prior to Visit  Medication Sig Dispense Refill  . acetaminophen  (TYLENOL) 325 MG tablet Take 650 mg by mouth every 6 (six) hours as needed for headache.    . albuterol (VENTOLIN HFA) 108 (90 Base) MCG/ACT inhaler Inhale 2 puffs into the lungs every 4 (four) hours as needed for wheezing.    Marland Kitchen apixaban (ELIQUIS) 5 MG TABS tablet Take 2 tablets (10mg ) twice daily for 7 days, then 1 tablet (5mg ) twice daily 60 tablet 1  . aspirin-acetaminophen-caffeine (EXCEDRIN MIGRAINE) 250-250-65 MG tablet Take 1 tablet by mouth every 6 (six) hours as needed for headache.    . ferrous sulfate 325 (65 FE) MG EC tablet Take 1 tablet (325 mg total) by mouth 3 (three) times daily with meals. take with stool softener/laxative 90 tablet 2  . Multiple Vitamin (MULTIVITAMIN) tablet Take 1 tablet by mouth daily.     No current facility-administered medications on file prior to visit.    Allergies  Allergen Reactions  . Penicillins Swelling    Social History   Socioeconomic History  . Marital status: Married    Spouse name: Not on file  . Number of children: Not on file  . Years of education: Not on file  . Highest education level: Not on file  Occupational History  . Not on file  Tobacco Use  . Smoking status: Never Smoker  . Smokeless tobacco: Never Used  Substance and Sexual Activity  . Alcohol use: No  . Drug use: No  .  Sexual activity: Not on file  Other Topics Concern  . Not on file  Social History Narrative  . Not on file   Social Determinants of Health   Financial Resource Strain:   . Difficulty of Paying Living Expenses:   Food Insecurity:   . Worried About Charity fundraiser in the Last Year:   . Arboriculturist in the Last Year:   Transportation Needs:   . Film/video editor (Medical):   Marland Kitchen Lack of Transportation (Non-Medical):   Physical Activity:   . Days of Exercise per Week:   . Minutes of Exercise per Session:   Stress:   . Feeling of Stress :   Social Connections:   . Frequency of Communication with Friends and Family:   . Frequency  of Social Gatherings with Friends and Family:   . Attends Religious Services:   . Active Member of Clubs or Organizations:   . Attends Archivist Meetings:   Marland Kitchen Marital Status:   Intimate Partner Violence:   . Fear of Current or Ex-Partner:   . Emotionally Abused:   Marland Kitchen Physically Abused:   . Sexually Abused:     No family history on file.  Past Surgical History:  Procedure Laterality Date  . BREAST REDUCTION SURGERY    . GANGLION CYST EXCISION      ROS: Review of Systems Negative except as stated above  PHYSICAL EXAM: BP 119/77   Pulse 77   Temp 99.1 F (37.3 C)   Resp 16   Ht 5\' 1"  (1.549 m)   Wt 250 lb 3.2 oz (113.5 kg)   LMP 07/01/2019   SpO2 96%   BMI 47.27 kg/m   Wt Readings from Last 3 Encounters:  07/01/19 250 lb 3.2 oz (113.5 kg)  05/04/19 236 lb 12.4 oz (107.4 kg)  08/31/18 260 lb (117.9 kg)    Physical Exam  General appearance - alert, well appearing, morbidly obese middle-aged African-American female and in no distress Mental status - normal mood, behavior, speech, dress, motor activity, and thought processes Mouth - mucous membranes moist, pharynx normal without lesions Neck - supple, no significant adenopathy Chest - clear to auscultation, no wheezes, rales or rhonchi, symmetric air entry Heart - normal rate, regular rhythm, normal S1, S2, no murmurs, rubs, clicks or gallops Extremities - peripheral pulses normal, no pedal edema, no clubbing or cyanosis   CMP Latest Ref Rng & Units 05/02/2019 05/01/2019 09/19/2012  Glucose 70 - 99 mg/dL 110(H) 112(H) 104(H)  BUN 6 - 20 mg/dL 14 12 13   Creatinine 0.44 - 1.00 mg/dL 0.82 0.77 0.80  Sodium 135 - 145 mmol/L 142 142 144  Potassium 3.5 - 5.1 mmol/L 3.9 3.1(L) 3.7  Chloride 98 - 111 mmol/L 106 106 108  CO2 22 - 32 mmol/L 26 25 25   Calcium 8.9 - 10.3 mg/dL 10.5(H) 10.6(H) 10.3  Total Protein 6.5 - 8.1 g/dL 8.0 8.4(H) 7.6  Total Bilirubin 0.3 - 1.2 mg/dL 1.3(H) 1.3(H) 0.7  Alkaline Phos 38 - 126  U/L 42 44 66  AST 15 - 41 U/L 20 21 24   ALT 0 - 44 U/L 15 15 18    Lipid Panel     Component Value Date/Time   TRIG 105 05/01/2019 1500    CBC    Component Value Date/Time   WBC 13.6 (H) 05/20/2019 1837   WBC 11.6 (H) 05/04/2019 0141   RBC 4.75 05/20/2019 1837   RBC 4.47 05/04/2019 0141   HGB 11.0 (  L) 05/20/2019 1837   HCT 35.8 05/20/2019 1837   PLT 349 05/20/2019 1837   MCV 75 (L) 05/20/2019 1837   MCH 23.2 (L) 05/20/2019 1837   MCH 20.8 (L) 05/04/2019 0141   MCHC 30.7 (L) 05/20/2019 1837   MCHC 28.7 (L) 05/04/2019 0141   RDW 26.5 (H) 05/20/2019 1837   LYMPHSABS 2.8 05/04/2019 0141   MONOABS 1.1 (H) 05/04/2019 0141   EOSABS 0.2 05/04/2019 0141   BASOSABS 0.1 05/04/2019 0141    ASSESSMENT AND PLAN: 1. Menorrhagia with regular cycle 2. Iron deficiency anemia due to chronic blood loss Continue iron supplement.   Keep appointment with GYN later this week She is due for Pap smear but is currently on her menstrual cycle.  If it is not done by the gynecologist on her visit with them later this week, we will plan to do it on her next visit. - CBC  3. DVT, lower extremity, distal, acute, bilateral ([facility]) Continue Eliquis.  Because she was bilateral, we will keep her on Eliquis for 3-6 mths.  May recheck doppler US before d/c  4. Class 3 severe obesity due to excess calories without serious comorbidity with body mass index (BMI) of 45.0 to 49.9 in adult [facility]) Since her breathing is much better post Covid, and encouraged her to start her exercise routine again but start low and go slow.  Dietary counseling given.  Discussed referral to the nutritionist or to the weight management program.  She will think about it and will let me know if she decides she wants referral.  Patient was given the opportunity to ask questions.  Patient verbalized understanding of the plan and was able to repeat key elements of the plan.   Orders Placed This Encounter  Procedures  . CBC      Requested Prescriptions    No prescriptions requested or ordered in this encounter    Return in about 4 months (around 10/31/2019).  Karle Plumber, MD, FACP

## 2019-07-02 LAB — CBC
Hematocrit: 34.6 % (ref 34.0–46.6)
Hemoglobin: 11 g/dL — ABNORMAL LOW (ref 11.1–15.9)
MCH: 26.9 pg (ref 26.6–33.0)
MCHC: 31.8 g/dL (ref 31.5–35.7)
MCV: 85 fL (ref 79–97)
Platelets: 330 10*3/uL (ref 150–450)
RBC: 4.09 x10E6/uL (ref 3.77–5.28)
RDW: 19.9 % — ABNORMAL HIGH (ref 11.7–15.4)
WBC: 9.8 10*3/uL (ref 3.4–10.8)

## 2019-07-03 ENCOUNTER — Other Ambulatory Visit: Payer: Self-pay | Admitting: Internal Medicine

## 2019-07-03 ENCOUNTER — Telehealth: Payer: Self-pay | Admitting: General Practice

## 2019-07-03 DIAGNOSIS — D500 Iron deficiency anemia secondary to blood loss (chronic): Secondary | ICD-10-CM

## 2019-07-03 MED ORDER — APIXABAN 5 MG PO TABS: 5.0000 mg | ORAL_TABLET | Freq: Two times a day (BID) | ORAL | 2 refills | Status: AC

## 2019-07-03 NOTE — Telephone Encounter (Signed)
Refill sent.

## 2019-07-03 NOTE — Telephone Encounter (Signed)
1) Medication(s) Requested (by name): apixaban (ELIQUIS) 5 MG TABS tablet [id number]   2) [facility], [address]  [place], [place] [id number]   3) Special Requests:   Approved medications will be sent to the pharmacy, we will reach out if there is an issue.  Requests made after 3pm may not be addressed until the following business day!  If a patient is unsure of the name of the medication(s) please note and ask patient to call back when they are able to provide all info, do not send to responsible party until all information is available!\

## 2019-07-03 NOTE — Progress Notes (Signed)
Let pt know that she still has mild anemia that is stable compared to 1 month ago.  Continue iron supplement.  Return to lab in 2 mths for repeat check.

## 2019-07-04 ENCOUNTER — Encounter: Payer: Self-pay | Admitting: Obstetrics & Gynecology

## 2019-07-04 ENCOUNTER — Encounter: Payer: BLUE CROSS/BLUE SHIELD | Admitting: Obstetrics & Gynecology

## 2019-07-04 ENCOUNTER — Other Ambulatory Visit: Payer: Self-pay

## 2019-07-04 ENCOUNTER — Telehealth: Payer: Self-pay

## 2019-07-04 ENCOUNTER — Ambulatory Visit (INDEPENDENT_AMBULATORY_CARE_PROVIDER_SITE_OTHER): Payer: BLUE CROSS/BLUE SHIELD | Admitting: Obstetrics & Gynecology

## 2019-07-04 ENCOUNTER — Other Ambulatory Visit (HOSPITAL_COMMUNITY)
Admission: RE | Admit: 2019-07-04 | Discharge: 2019-07-04 | Disposition: A | Discharge status: Home / Self Care | Payer: BLUE CROSS/BLUE SHIELD | Source: Ambulatory Visit | Attending: Obstetrics & Gynecology | Admitting: Obstetrics & Gynecology

## 2019-07-04 VITALS — BP 120/75 | HR 84 | Ht 61.0 in | Wt 244.0 lb

## 2019-07-04 DIAGNOSIS — Z01419 Encounter for gynecological examination (general) (routine) without abnormal findings: Secondary | ICD-10-CM | POA: Insufficient documentation

## 2019-07-04 DIAGNOSIS — N920 Excessive and frequent menstruation with regular cycle: Secondary | ICD-10-CM | POA: Insufficient documentation

## 2019-07-04 DIAGNOSIS — Z86718 Personal history of other venous thrombosis and embolism: Secondary | ICD-10-CM | POA: Diagnosis not present

## 2019-07-04 MED ORDER — MEGESTROL ACETATE 40 MG PO TABS: 40.0000 mg | ORAL_TABLET | Freq: Every day | ORAL | 3 refills | Status: AC

## 2019-07-04 NOTE — Telephone Encounter (Signed)
Will forward to pcp

## 2019-07-04 NOTE — Patient Instructions (Signed)
Uterine Fibroids  Uterine fibroids (leiomyomas) are noncancerous (benign) tumors that can develop in the uterus. Fibroids may also develop in the fallopian tubes, cervix, or tissues (ligaments) near the uterus. You may have one or many fibroids. Fibroids vary in size, weight, and where they grow in the uterus. Some can become quite large. Most fibroids do not require medical treatment. What are the causes? The cause of this condition is not known. What increases the risk? You are more likely to develop this condition if you:  Are in your 30s or 40s and have not gone through menopause.  Have a family history of this condition.  Are of African-American descent.  Had your first period at an early age (early menarche).  Have not had any children (nulliparity).  Are overweight or obese. What are the signs or symptoms? Many women do not have any symptoms. Symptoms of this condition may include:  Heavy menstrual bleeding.  Bleeding or spotting between periods.  Pain and pressure in the pelvic area, between the hips.  Bladder problems, such as needing to urinate urgently or more often than usual.  Inability to have children (infertility).  Failure to carry pregnancy to term (miscarriage). How is this diagnosed? This condition may be diagnosed based on:  Your symptoms and medical history.  A physical exam.  A pelvic exam that includes feeling for any tumors.  Imaging tests, such as ultrasound or MRI. How is this treated? Treatment for this condition may include:  Seeing your health care provider for follow-up visits to monitor your fibroids for any changes.  Taking NSAIDs such as ibuprofen, naproxen, or aspirin to reduce pain.  Hormone medicines. These may be taken as a pill, given in an injection, or delivered by a T-shaped device that is inserted into the uterus (intrauterine device, IUD).  Surgery to remove one of the following: ? The fibroids (myomectomy). Your health  care provider may recommend this if fibroids affect your fertility and you want to become pregnant. ? The uterus (hysterectomy). ? Blood supply to the fibroids (uterine artery embolization). Follow these instructions at home:  Take over-the-counter and prescription medicines only as told by your health care provider.  Ask your health care provider if you should take iron pills or eat more iron-rich foods, such as dark green, leafy vegetables. Heavy menstrual bleeding can cause low iron levels.  If directed, apply heat to your back or abdomen to reduce pain. Use the heat source that your health care provider recommends, such as a moist heat pack or a heating pad. ? Place a towel between your skin and the heat source. ? Leave the heat on for 20-30 minutes. ? Remove the heat if your skin turns bright red. This is especially important if you are unable to feel pain, heat, or cold. You may have a greater risk of getting burned.  Pay close attention to your menstrual cycle. Tell your health care provider about any changes, such as: ? Increased blood flow that requires you to use more pads or tampons than usual. ? A change in the number of days that your period lasts. ? A change in symptoms that are associated with your period, such as back pain or cramps in your abdomen.  Keep all follow-up visits as told by your health care provider. This is important, especially if your fibroids need to be monitored for any changes. Contact a health care provider if you:  Have pelvic pain, back pain, or cramps in your abdomen that   do not get better with medicine or heat.  Develop new bleeding between periods.  Have increased bleeding during or between periods.  Feel unusually tired or weak.  Feel light-headed. Get help right away if you:  Faint.  Have pelvic pain that suddenly gets worse.  Have severe vaginal bleeding that soaks a tampon or pad in 30 minutes or less. Summary  Uterine fibroids are  noncancerous (benign) tumors that can develop in the uterus.  The exact cause of this condition is not known.  Most fibroids do not require medical treatment unless they affect your ability to have children (fertility).  Contact a health care provider if you have pelvic pain, back pain, or cramps in your abdomen that do not get better with medicines.  Make sure you know what symptoms should cause you to get help right away. This information is not intended to replace advice given to you by your health care provider. Make sure you discuss any questions you have with your health care provider. Document Revised: 02/24/2017 Document Reviewed: 02/07/2017 Elsevier Patient Education  2020 [employer] Menorrhagia  Menorrhagia is a condition in which menstrual periods are heavy or last longer than normal. With menorrhagia, most periods a woman has may cause enough blood loss and cramping that she becomes unable to take part in her usual activities. What are the causes? Common causes of this condition include:  Noncancerous growths in the uterus (polyps or fibroids).  An imbalance of the estrogen and progesterone hormones.  One of the ovaries not releasing an egg during one or more months.  A problem with the thyroid gland (hypothyroid).  Side effects of having an intrauterine device (IUD).  Side effects of some medicines, such as anti-inflammatory medicines or blood thinners.  A bleeding disorder that stops the blood from clotting normally. In some cases, the cause of this condition is not known. What are the signs or symptoms? Symptoms of this condition include:  Routinely having to change your pad or tampon every 1-2 hours because it is completely soaked.  Needing to use pads and tampons at the same time because of heavy bleeding.  Needing to wake up to change your pads or tampons during the night.  Passing blood clots larger than 1 inch (2.5 cm) in size.  Having bleeding that  lasts for more than 7 days.  Having symptoms of low iron levels (anemia), such as tiredness, fatigue, or shortness of breath. How is this diagnosed? This condition may be diagnosed based on:  A physical exam.  Your symptoms and menstrual history.  Tests, such as: ? Blood tests to check if you are pregnant or have hormonal changes, a bleeding or thyroid disorder, anemia, or other problems. ? Pap test to check for cancerous changes, infections, or inflammation. ? Endometrial biopsy. This test involves removing a tissue sample from the lining of the uterus (endometrium) to be examined under a microscope. ? Pelvic ultrasound. This test uses sound waves to create images of your uterus, ovaries, and vagina. The images can show if you have fibroids or other growths. ? Hysteroscopy. For this test, a small telescope is used to look inside your uterus. How is this treated? Treatment may not be needed for this condition. If it is needed, the best treatment for you will depend on:  Whether you need to prevent pregnancy.  Your desire to have children in the future.  The cause and severity of your bleeding.  Your personal preference. Medicines are the first step   in treatment. You may be treated with:  Hormonal birth control methods. These treatments reduce bleeding during your menstrual period. They include: ? Birth control pills. ? Skin patch. ? Vaginal ring. ? Shots (injections) that you get every 3 months. ? Hormonal IUD (intrauterine device). ? Implants that go under the skin.  Medicines that thicken blood and slow bleeding.  Medicines that reduce swelling, such as ibuprofen.  Medicines that contain an artificial (synthetic) hormone called progestin.  Medicines that make the ovaries stop working for a short time.  Iron supplements to treat anemia. If medicines do not work, surgery may be done. Surgical options may include:  Dilation and curettage (D&C). In this procedure, your  health care provider opens (dilates) your cervix and then scrapes or suctions tissue from the endometrium to reduce menstrual bleeding.  Operative hysteroscopy. In this procedure, a small tube with a light on the end (hysteroscope) is used to view your uterus and help remove polyps that may be causing heavy periods.  Endometrial ablation. This is when various techniques are used to permanently destroy your entire endometrium. After endometrial ablation, most women have little or no menstrual flow. This procedure reduces your ability to become pregnant.  Endometrial resection. In this procedure, an electrosurgical wire loop is used to remove the endometrium. This procedure reduces your ability to become pregnant.  Hysterectomy. This is surgical removal of the uterus. This is a permanent procedure that stops menstrual periods. Pregnancy is not possible after a hysterectomy. Follow these instructions at home: Medicines  Take over-the-counter and prescription medicines exactly as told by your health care provider. This includes iron pills.  Do not change or switch medicines without asking your health care provider.  Do not take aspirin or medicines that contain aspirin 1 week before or during your menstrual period. Aspirin may make bleeding worse. General instructions  If you need to change your sanitary pad or tampon more than once every 2 hours, limit your activity until the bleeding stops.  Iron pills can cause constipation. To prevent or treat constipation while you are taking prescription iron supplements, your health care provider may recommend that you: ? Drink enough fluid to keep your urine clear or pale yellow. ? Take over-the-counter or prescription medicines. ? Eat foods that are high in fiber, such as fresh fruits and vegetables, whole grains, and beans. ? Limit foods that are high in fat and processed sugars, such as fried and sweet foods.  Eat well-balanced meals, including  foods that are high in iron. Foods that have a lot of iron include leafy green vegetables, meat, liver, eggs, and whole grain breads and cereals.  Do not try to lose weight until the abnormal bleeding has stopped and your blood iron level is back to normal. If you need to lose weight, work with your health care provider to lose weight safely.  Keep all follow-up visits as told by your health care provider. This is important. Contact a health care provider if:  You soak through a pad or tampon every 1 or 2 hours, and this happens every time you have a period.  You need to use pads and tampons at the same time because you are bleeding so much.  You have nausea, vomiting, diarrhea, or other problems related to medicines you are taking. Get help right away if:  You soak through more than a pad or tampon in 1 hour.  You pass clots bigger than 1 inch (2.5 cm) wide.  You feel short   of breath.  You feel like your heart is beating too fast.  You feel dizzy or faint.  You feel very weak or tired. Summary  Menorrhagia is a condition in which menstrual periods are heavy or last longer than normal.  Treatment will depend on the cause of the condition and may include medicines or procedures.  Take over-the-counter and prescription medicines exactly as told by your health care provider. This includes iron pills.  Get help right away if you have heavy bleeding that soaks through more than a pad or tampon in 1 hour, you are passing large clots, or you feel dizzy, faint or short of breath. This information is not intended to replace advice given to you by your health care provider. Make sure you discuss any questions you have with your health care provider. Document Revised: 06/21/2017 Document Reviewed: 03/07/2016 Elsevier Patient Education  2020 [employer] Inc.  

## 2019-07-04 NOTE — Progress Notes (Signed)
Patient states she has been sent due to fibroids and anemia. (referral from internal medicine- DR. Johnson). Patient is due for pap smear.  Patient states she was admitted for blood clots in Feb. 2021. Kathrene Alu RN

## 2019-07-04 NOTE — Telephone Encounter (Signed)
Contacted pt to go over Dr. Wynetta Emery response and lab results pt didn't answer left a detailed vm informing pt and if she has any questions or concerns to give [place] a call

## 2019-07-04 NOTE — Progress Notes (Signed)
Subjective:     Christine Spencer is a 49 y.o. female here for a routine exam. G0 Menses: pt reports monthly cycles that are heavy with clots. Seh wears 2 Depends with her cycles. She was hospitalized with Covid in 04/2019. At that time she has SOB and anemia. Pt does not consider the bleeding very heavy. She was seen last year by Ob/GYN as she and her husband want to know why they have not been able to conceive.       Gynecologic History Patient's last menstrual period was 07/01/2019. Contraception: none Last Pap: 2019 HPV only seen. Results were: normal Last mammogram: 01/2019. Results were: normal  Obstetric History OB History  Gravida Para Term Preterm AB Living  0 0 0 0 0 0  SAB TAB Ectopic Multiple Live Births  0 0 0 0 0   The following portions of the patient's history were reviewed and updated as appropriate: allergies, current medications, past family history, past medical history, past social history, past surgical history and problem list.  Review of Systems Pertinent items are noted in HPI.    Objective:  BP 120/75   Pulse 84   Ht 5\' 1"  (1.549 m)   Wt 244 lb (110.7 kg)   LMP 07/01/2019   BMI 46.10 kg/m  General Appearance:    Alert, cooperative, no distress, appears stated age  Head:    Normocephalic, without obvious abnormality, atraumatic  Eyes:    conjunctiva/corneas clear, EOM's intact, both eyes  Ears:    Normal external ear canals, both ears  Nose:   Nares normal, septum midline, mucosa normal, no drainage    or sinus tenderness  Throat:   Lips, mucosa, and tongue normal; teeth and gums normal  Neck:   Supple, symmetrical, trachea midline, no adenopathy;    thyroid:  no enlargement/tenderness/nodules  Back:     Symmetric, no curvature, ROM normal, no CVA tenderness  Lungs:     respirations unlabored  Chest Wall:    No tenderness or deformity   Heart:    Regular rate and rhythm  Breast Exam:    No tenderness, masses, or nipple abnormality  Abdomen:     Soft,  non-tender, bowel sounds active all four quadrants,    no masses, no organomegaly  Genitalia:    Normal female without lesion, discharge or tenderness   Uterus: small mobile.    Extremities:   Extremities normal, atraumatic, no cyanosis or edema  Pulses:   2+ and symmetric all extremities  Skin:   Skin color, texture, turgor normal, no rashes or lesions    05/30/2019 CLINICAL DATA:  Menorrhagia with regular cycle  EXAM: TRANSABDOMINAL AND TRANSVAGINAL ULTRASOUND OF PELVIS  TECHNIQUE: Both transabdominal and transvaginal ultrasound examinations of the pelvis were performed. Transabdominal technique was performed for global imaging of the pelvis including uterus, ovaries, adnexal regions, and pelvic cul-de-sac. It was necessary to proceed with endovaginal exam following the transabdominal exam to visualize the uterus, endometrial complex, and ovaries.  COMPARISON:  None  FINDINGS: Uterus  Measurements: 8.7 x 5.4 x 5.5 cm = volume: 136 mL. Submucosal leiomyoma at anterior upper uterus 3.2 x 2.4 x 3.2 cm. No additional uterine masses.  Endometrium  Thickness: 11 mm.  No endometrial fluid or focal abnormality  Right ovary  Measurements: 3.0 x 1.7 x 1.5 cm = volume: 3.9 mL. Normal morphology without mass  Left ovary  Measurements: 3.6 x 1.8 x 1.6 cm = volume: 5.5 mL. Normal morphology without mass  Other  findings  No free pelvic fluid.  No adnexal masses.  IMPRESSION: 3.2 cm diameter submucosal leiomyoma at anterior upper uterus.  Remainder of exam unremarkable. Assessment:    Healthy female exam.   Menorrhagia leading to anemia. Reviewed with pt treatment options all of which will limit pregnancy. Pt was crying and expressed frustration for >20 years she and her husband have been unable to conceive and have never been given a reason.  I did make it clear that the treatment options will limit conception.   I have also reviewed the risks of anemia and  chronic excessive blood loss.      Plan:    f/u PAP with hr HPV  Megace 40mg  daily  F/u in 3 months or sooner prn   Lemon Whitacre L. Harraway-Smith, M.D., Cherlynn June

## 2019-07-04 NOTE — Telephone Encounter (Signed)
Patient called and states that she has started a new medication(Megestrol) and she is wanting to know if this medication will effect her getting her COVID vaccine.

## 2019-07-05 LAB — CYTOLOGY - PAP
Comment: NEGATIVE
Diagnosis: NEGATIVE
High risk HPV: NEGATIVE

## 2019-07-19 ENCOUNTER — Telehealth: Payer: Self-pay

## 2019-07-19 NOTE — Telephone Encounter (Signed)
States the pain has happened on 3 occasion where she has a pain shoot through her head.  Taking 3 medications Megace, Iron, and Eliquis Denies constant headaches, blurred vision, dizziness, weakness.Does not have facial drooping.  Advised if headaches are more frequent and have the above sx's, she would need to go to the ED for further evaluation. Also advised to jot down what she is doing at the time of next head ache and how often it comes and the duration. Patient verbalized understanding.  She would like to know when she can start taking vitamins again- Vitamin C and elderberry   Patient wanted to inform that she  took 1st dose of the Covid-vaccine [employer]).

## 2019-07-19 NOTE — Telephone Encounter (Signed)
Patient states that she is starting to have sharp pains on the top of her head and does not know if this has to do with her medication.  Patient would like to know when she can start taking her vitamins again.  Please advice [phone]

## 2019-09-03 ENCOUNTER — Encounter (HOSPITAL_BASED_OUTPATIENT_CLINIC_OR_DEPARTMENT_OTHER): Payer: Self-pay

## 2019-09-03 ENCOUNTER — Emergency Department (HOSPITAL_BASED_OUTPATIENT_CLINIC_OR_DEPARTMENT_OTHER): Payer: BLUE CROSS/BLUE SHIELD

## 2019-09-03 ENCOUNTER — Emergency Department (HOSPITAL_BASED_OUTPATIENT_CLINIC_OR_DEPARTMENT_OTHER)
Admission: EM | Admit: 2019-09-03 | Discharge: 2019-09-03 | Disposition: A | Discharge status: Home / Self Care | Payer: BLUE CROSS/BLUE SHIELD | Attending: Emergency Medicine | Admitting: Emergency Medicine

## 2019-09-03 ENCOUNTER — Other Ambulatory Visit: Payer: Self-pay

## 2019-09-03 DIAGNOSIS — Z7982 Long term (current) use of aspirin: Secondary | ICD-10-CM | POA: Diagnosis not present

## 2019-09-03 DIAGNOSIS — M25552 Pain in left hip: Secondary | ICD-10-CM | POA: Insufficient documentation

## 2019-09-03 DIAGNOSIS — N939 Abnormal uterine and vaginal bleeding, unspecified: Secondary | ICD-10-CM

## 2019-09-03 DIAGNOSIS — Z7901 Long term (current) use of anticoagulants: Secondary | ICD-10-CM | POA: Diagnosis not present

## 2019-09-03 DIAGNOSIS — D259 Leiomyoma of uterus, unspecified: Secondary | ICD-10-CM | POA: Diagnosis not present

## 2019-09-03 DIAGNOSIS — Z79899 Other long term (current) drug therapy: Secondary | ICD-10-CM | POA: Diagnosis not present

## 2019-09-03 DIAGNOSIS — Z880 Allergy status to penicillin: Secondary | ICD-10-CM | POA: Insufficient documentation

## 2019-09-03 DIAGNOSIS — J45909 Unspecified asthma, uncomplicated: Secondary | ICD-10-CM | POA: Diagnosis not present

## 2019-09-03 LAB — PROTIME-INR
INR: 1.1 (ref 0.8–1.2)
Prothrombin Time: 13.4 s (ref 11.4–15.2)

## 2019-09-03 LAB — CBC WITH DIFFERENTIAL/PLATELET
Abs Immature Granulocytes: 0.04 10*3/uL (ref 0.00–0.07)
Basophils Absolute: 0.1 10*3/uL (ref 0.0–0.1)
Basophils Relative: 1 %
Eosinophils Absolute: 0.1 10*3/uL (ref 0.0–0.5)
Eosinophils Relative: 1 %
HCT: 32.5 % — ABNORMAL LOW (ref 36.0–46.0)
Hemoglobin: 10.4 g/dL — ABNORMAL LOW (ref 12.0–15.0)
Immature Granulocytes: 0 %
Lymphocytes Relative: 23 %
Lymphs Abs: 2.1 10*3/uL (ref 0.7–4.0)
MCH: 27.2 pg (ref 26.0–34.0)
MCHC: 32 g/dL (ref 30.0–36.0)
MCV: 84.9 fL (ref 80.0–100.0)
Monocytes Absolute: 0.7 10*3/uL (ref 0.1–1.0)
Monocytes Relative: 8 %
Neutro Abs: 6 10*3/uL (ref 1.7–7.7)
Neutrophils Relative %: 67 %
Platelets: 314 10*3/uL (ref 150–400)
RBC: 3.83 MIL/uL — ABNORMAL LOW (ref 3.87–5.11)
RDW: 14.6 % (ref 11.5–15.5)
WBC: 9 10*3/uL (ref 4.0–10.5)
nRBC: 0 % (ref 0.0–0.2)

## 2019-09-03 LAB — URINALYSIS, MICROSCOPIC (REFLEX): RBC / HPF: >50 RBC/hpf (ref 0–5)

## 2019-09-03 LAB — BASIC METABOLIC PANEL
Anion gap: 8 (ref 5–15)
BUN: 10 mg/dL (ref 6–20)
CO2: 21 mmol/L — ABNORMAL LOW (ref 22–32)
Calcium: 9.7 mg/dL (ref 8.9–10.3)
Chloride: 108 mmol/L (ref 98–111)
Creatinine, Ser: 0.84 mg/dL (ref 0.44–1.00)
GFR calc Af Amer: >60 mL/min (ref >60)
GFR calc non Af Amer: >60 mL/min (ref >60)
Glucose, Bld: 119 mg/dL — ABNORMAL HIGH (ref 70–99)
Potassium: 3.7 mmol/L (ref 3.5–5.1)
Sodium: 137 mmol/L (ref 135–145)

## 2019-09-03 LAB — URINALYSIS, ROUTINE W REFLEX MICROSCOPIC
Bilirubin Urine: NEGATIVE
Glucose, UA: NEGATIVE mg/dL
Ketones, ur: NEGATIVE mg/dL
Leukocytes,Ua: NEGATIVE
Nitrite: NEGATIVE
Protein, ur: NEGATIVE mg/dL
Specific Gravity, Urine: 1.02 (ref 1.005–1.030)
pH: 7 (ref 5.0–8.0)

## 2019-09-03 LAB — HCG, SERUM, QUALITATIVE: Preg, Serum: NEGATIVE

## 2019-09-03 NOTE — Discharge Instructions (Addendum)
Stop taking the Eliquis you were previously prescribed.  Begin taking Megace at 40 mg twice daily while you are experiencing bleeding, then 20 mg once daily when bleeding has resolved.  You are to follow-up with your GYN in the next week.  Call tomorrow to make these arrangements.

## 2019-09-03 NOTE — ED Notes (Signed)
Pt discharged to home. Discharge instructions have been discussed with patient and/or family members. Pt verbally acknowledges understanding d/c instructions, and endorses comprehension to checkout at registration before leaving.  °

## 2019-09-03 NOTE — ED Triage Notes (Signed)
Pt c/o vaginal bleeding x 2 weeks-also c/o "popping in the groin area" that makes walking painful x 2 months-states she was seen by ortho with no improvement-NAD-to triage in w/c

## 2019-09-03 NOTE — ED Provider Notes (Signed)
[facility] Provider Note   CSN: [id number] Arrival date & time: 09/03/19  1100     History Chief Complaint  Patient presents with  . Vaginal Bleeding    Christine Spencer is a 49 y.o. female.  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  Patient has been taking Eliquis since then.  Since starting the Eliquis, she has had increased bleeding with her menstrual periods.  The current menstrual period has been ongoing for the past 2 weeks.  She had an ultrasound in March showing a fibroid within the uterine wall.  She was prescribed Megace, however this does not seem to be helping.  Patient stopped her Eliquis 4 days ago to see if her bleeding would resolve, however it has persisted.  She describes both clots and period-like bleeding.  She denies chest pain or shortness of breath.  She denies any abdominal pain.  She also has the complaint of pain in her left hip.  She describes her hip making a "pop" when she lifted her leg out of the car.  She has been experiencing pain with ambulation in the left groin since.  She was seen by orthopedics and had x-rays, but no definitive diagnosis was made.  Her pain continues with ambulation.  The history is provided by the patient.  Vaginal Bleeding Quality:  Clots and bright red Severity:  Moderate Onset quality:  Sudden Duration:  2 weeks Timing:  Constant Progression:  Unchanged Chronicity:  New Possible pregnancy: no   Relieved by:  Nothing Worsened by:  Nothing      Past Medical History:  Diagnosis Date  . Asthma   . Bronchitis   . Morbid obesity [facility])     Patient Active Problem List   Diagnosis Date Noted  . History of DVT in adulthood 07/04/2019  . Class 3 severe obesity due to excess calories without serious comorbidity with body mass index (BMI) of 45.0 to 49.9 in adult ([place]) 07/01/2019  .  Menorrhagia with regular cycle 05/14/2019  . SOB (shortness of breath) 05/02/2019  . COVID-19 virus infection 05/02/2019  . Hypokalemia 05/02/2019  . Anemia 05/02/2019  . Asthma   . Acute on chronic anemia 05/01/2019    Past Surgical History:  Procedure Laterality Date  . BREAST REDUCTION SURGERY    . GANGLION CYST EXCISION       OB History    Gravida  0   Para  0   Term  0   Preterm  0   AB  0   Living  0     SAB  0   TAB  0   Ectopic  0   Multiple  0   Live Births  0           Family History  Problem Relation Age of Onset  . Cancer Maternal Grandmother   . Stroke Father   . Diabetes Other     Social History   Tobacco Use  . Smoking status: Never Smoker  . Smokeless tobacco: Never Used  Substance Use Topics  . Alcohol use: No  . Drug use: No    Home Medications Prior to Admission medications   Medication Sig Start Date End Date Taking? Authorizing Provider  acetaminophen (TYLENOL) 325 MG tablet Take 650 mg by mouth every 6 (six) hours as needed for headache.    [provider]  albuterol (VENTOLIN  HFA) 108 (90 Base) MCG/ACT inhaler Inhale 2 puffs into the lungs every 4 (four) hours as needed for wheezing. 04/30/16   [provider]  apixaban (ELIQUIS) 5 MG TABS tablet Take 1 tablet (5 mg total) by mouth 2 (two) times daily. 07/03/19   Ladell Pier, MD  aspirin-acetaminophen-caffeine (EXCEDRIN MIGRAINE) [phone] MG tablet Take 1 tablet by mouth every 6 (six) hours as needed for headache.    [provider]  ferrous sulfate 325 (65 FE) MG EC tablet Take 1 tablet (325 mg total) by mouth 3 (three) times daily with meals. take with stool softener/laxative 05/04/19   Patrecia Pour, MD  megestrol (MEGACE) 40 MG tablet Take 1 tablet (40 mg total) by mouth daily. Can increase to two tablets twice a day in the event of heavy bleeding 07/04/19   Lavonia Drafts, MD  Multiple Vitamin (MULTIVITAMIN) tablet Take 1 tablet by  mouth daily.    [provider]    Allergies    Penicillins  Review of Systems   Review of Systems  Genitourinary: Positive for vaginal bleeding.  All other systems reviewed and are negative.   Physical Exam Updated Vital Signs BP 117/75 (BP Location: Left Arm)   Pulse 80   Temp 99.5 F (37.5 C) (Oral)   Resp 18   Ht 5\' 1"  (1.549 m)   Wt 116.1 kg   LMP 08/20/2019 (Approximate)   SpO2 98%   BMI 48.37 kg/m   Physical Exam Vitals and nursing note reviewed.  Constitutional:      General: She is not in acute distress.    Appearance: She is well-developed. She is not diaphoretic.  HENT:     Head: Normocephalic and atraumatic.  Cardiovascular:     Rate and Rhythm: Normal rate and regular rhythm.     Heart sounds: No murmur. No friction rub. No gallop.   Pulmonary:     Effort: Pulmonary effort is normal. No respiratory distress.     Breath sounds: Normal breath sounds. No wheezing.  Abdominal:     General: Bowel sounds are normal. There is no distension.     Palpations: Abdomen is soft.     Tenderness: There is no abdominal tenderness.  Musculoskeletal:        General: Normal range of motion.     Cervical back: Normal range of motion and neck supple.  Skin:    General: Skin is warm and dry.  Neurological:     Mental Status: She is alert and oriented to person, place, and time.     ED Results / Procedures / Treatments   Labs (all labs ordered are listed, but only abnormal results are displayed) Labs Reviewed  BASIC METABOLIC PANEL  CBC WITH DIFFERENTIAL/PLATELET  PROTIME-INR  HCG, SERUM, QUALITATIVE    EKG None  Radiology No results found.  Procedures Procedures (including critical care time)  Medications Ordered in ED Medications - No data to display  ED Course  I have reviewed the triage vital signs and the nursing notes.  Pertinent labs & imaging results that were available during my care of the patient were reviewed by me and  considered in my medical decision making (see chart for details).    MDM Rules/Calculators/A&P  Patient is a 49 year old female presenting with vaginal bleeding.  She was diagnosed with a DVT while she was hospitalized with COVID-19 in January.  She has been on Eliquis and experienced worsening bleeding with her periods since.  She was given Megace  and has been taking this as prescribed, but did not seem to help.  She stopped taking both medications 4 days ago as they did not seem to be helping.  Patient's hemoglobin today is 10.4 and her vitals are stable.  She has been observed for several hours in the ER and is having no further bleeding.  Ultrasound obtained shows the fibroid which is unchanged in appearance and no other acute pathology.  These findings were discussed with Dr. Nehemiah Settle from GYN.  We are in agreement that as the patient has been anticoagulated for nearly 6 months, stopping the Eliquis is reasonable.  She will be advised however to restart her Megace at 40 mg twice daily and titrate as needed.  Patient is to follow-up with Dr. Ihor Dow, her GYN in the next week.  Final Clinical Impression(s) / ED Diagnoses Final diagnoses:  None    Rx / DC Orders ED Discharge Orders    None       Veryl Speak, MD 09/03/19 1502

## 2019-09-03 NOTE — ED Notes (Signed)
ED Provider at bedside. 

## 2019-09-11 ENCOUNTER — Ambulatory Visit: Payer: BLUE CROSS/BLUE SHIELD | Admitting: Internal Medicine

## 2019-09-25 ENCOUNTER — Ambulatory Visit: Payer: BLUE CROSS/BLUE SHIELD | Admitting: Obstetrics & Gynecology

## 2019-11-04 ENCOUNTER — Ambulatory Visit: Payer: BLUE CROSS/BLUE SHIELD | Attending: Internal Medicine | Admitting: Internal Medicine

## 2020-11-10 ENCOUNTER — Telehealth: Payer: Self-pay

## 2020-11-10 NOTE — Telephone Encounter (Signed)
Contacted pt initially to schedule for mobile mammo event. Upon reviewing the chart pt may have a new PCP.

## 2020-11-24 IMAGING — US US PELVIS COMPLETE WITH TRANSVAGINAL
1 series · 14 of 25 positions shown · non-contrast
Comparison: None

CLINICAL DATA: Menorrhagia with regular cycle

EXAM:
TRANSABDOMINAL AND TRANSVAGINAL ULTRASOUND OF PELVIS
TECHNIQUE: Both transabdominal and transvaginal ultrasound examinations of the
pelvis were performed. Transabdominal technique was performed for
global imaging of the pelvis including uterus, ovaries, adnexal
regions, and pelvic cul-de-sac. It was necessary to proceed with
endovaginal exam following the transabdominal exam to visualize the
uterus, endometrial complex, and ovaries.

[Series 1: us pelvis complete with transvaginal · 14 of 83 slices shown]
[im 1/83]
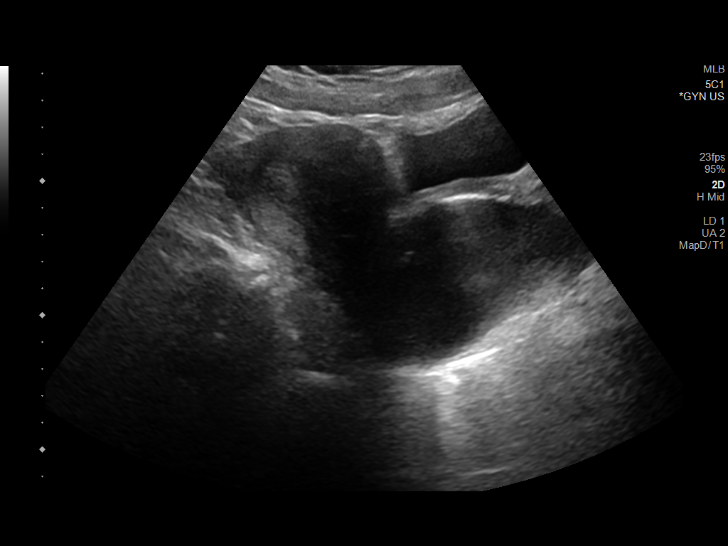
[im 7/83]
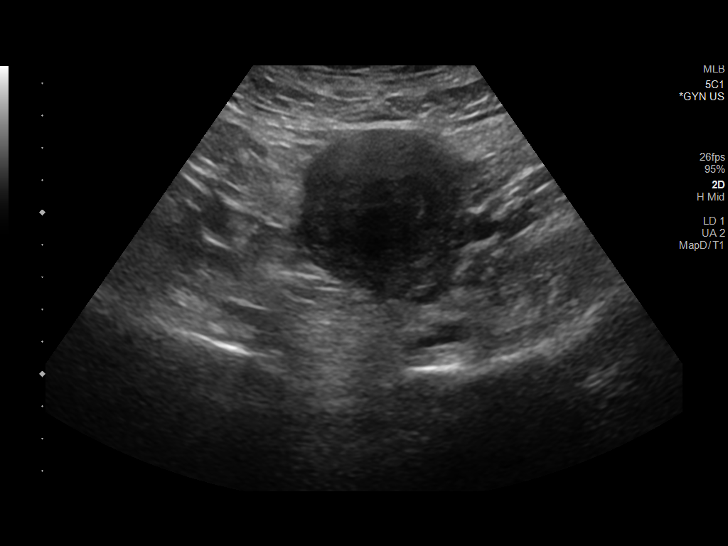
[im 14/83]
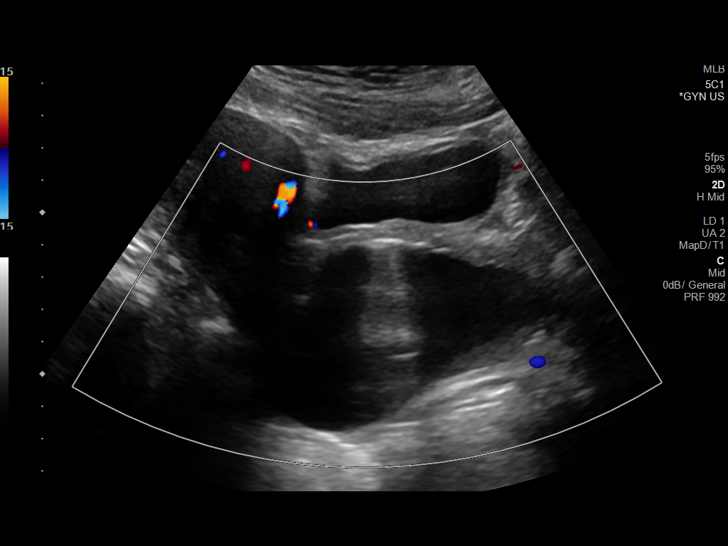
[im 21/83]
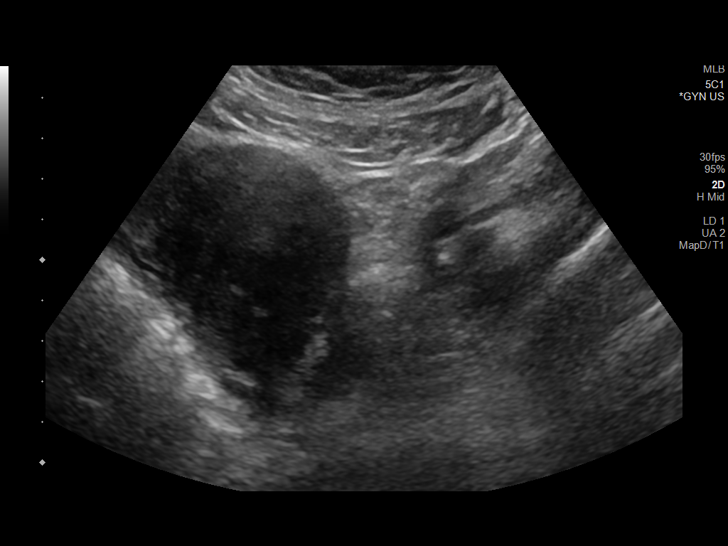
[im 28/83]
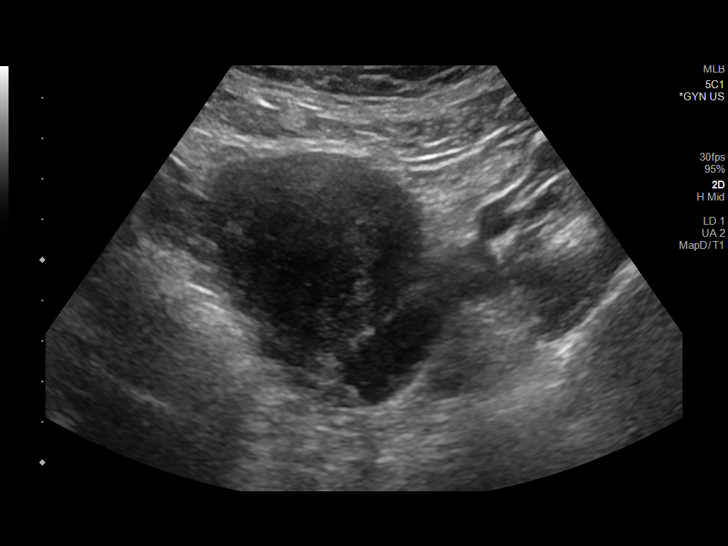
[im 31/83]
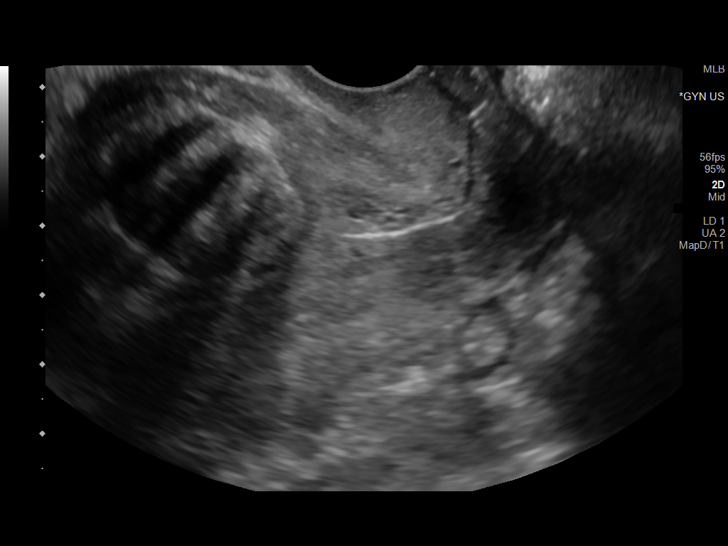
[im 38/83]
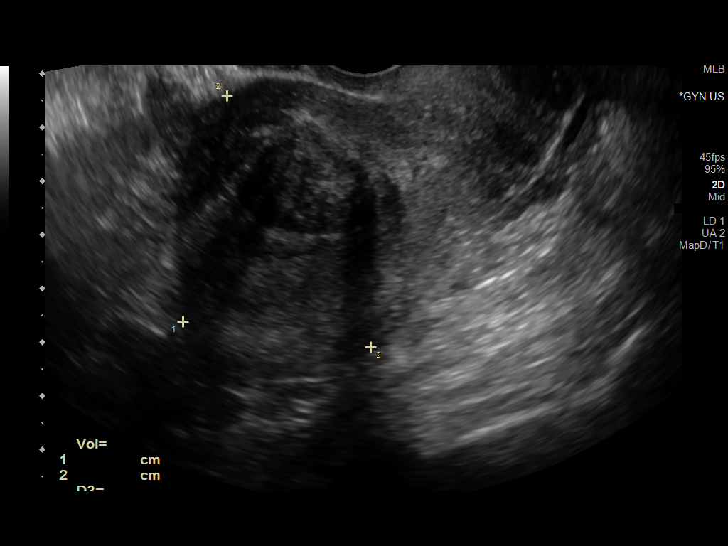
[im 45/83]
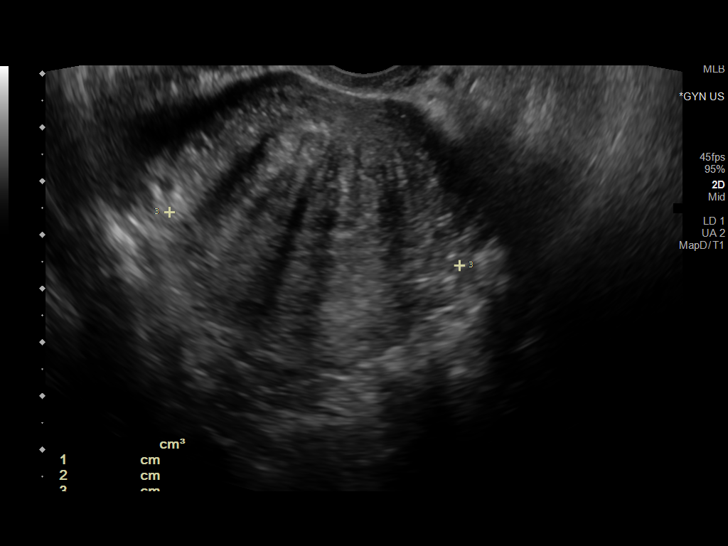
[im 52/83]
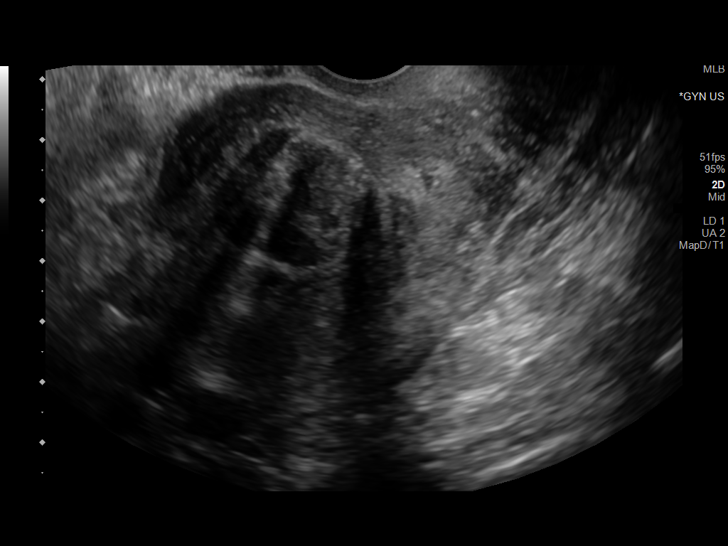
[im 55/83]
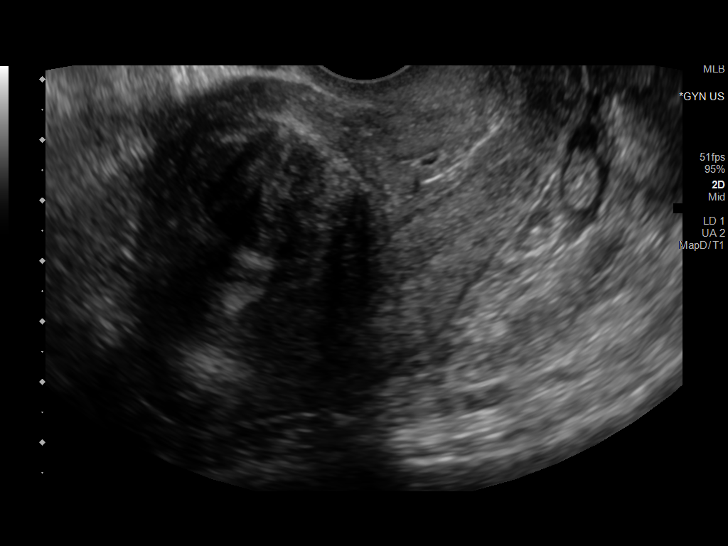
[im 62/83]
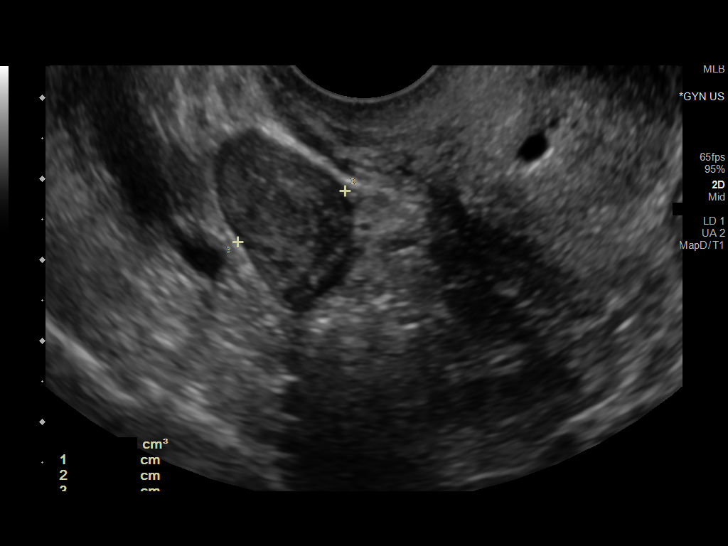
[im 69/83]
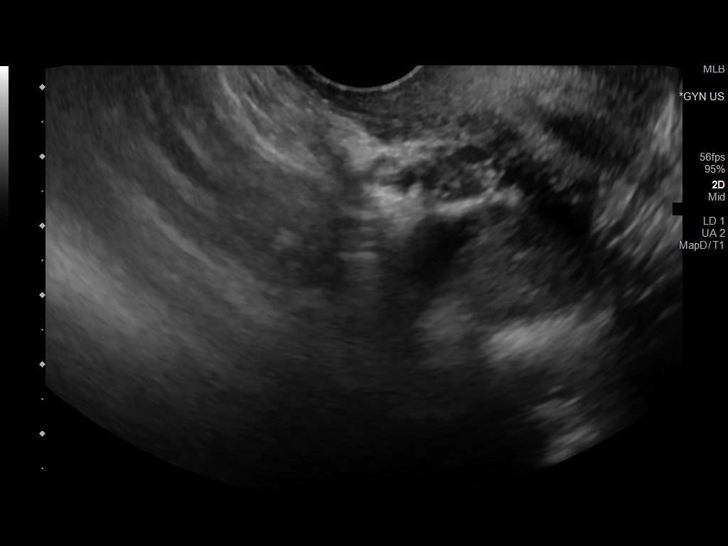
[im 76/83]
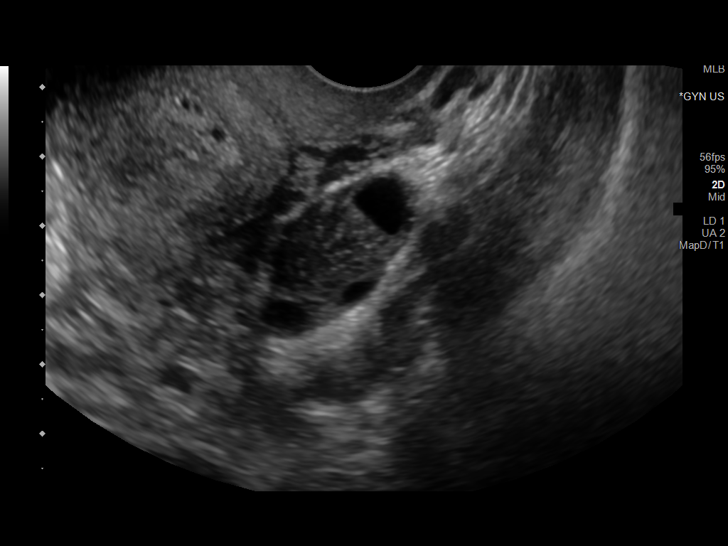
[im 83/83]
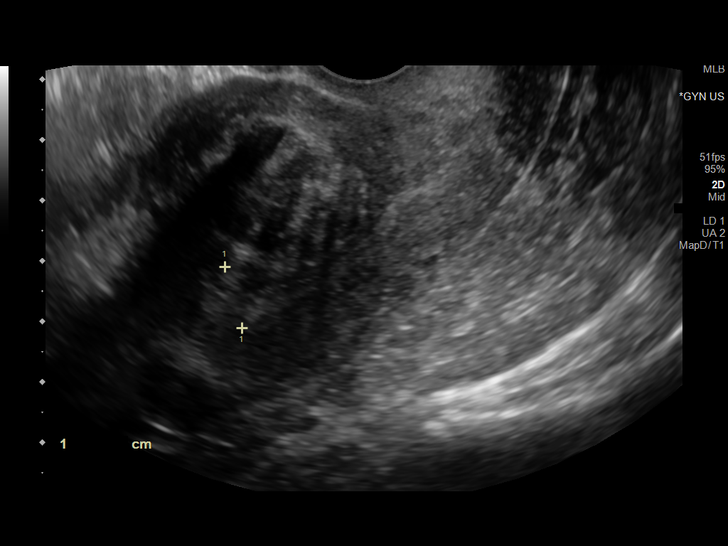

[14 of 25 positions shown; findings below may reference images not displayed]

FINDINGS: Uterus

Measurements: 8.7 x 5.4 x 5.5 cm = volume: 136 mL. Submucosal
leiomyoma at anterior upper uterus 3.2 x 2.4 x 3.2 cm. No additional
uterine masses.

Endometrium

Thickness: 11 mm.  No endometrial fluid or focal abnormality

Right ovary

Measurements: 3.0 x 1.7 x 1.5 cm = volume: 3.9 mL. Normal morphology
without mass

Left ovary

Measurements: 3.6 x 1.8 x 1.6 cm = volume: 5.5 mL. Normal morphology
without mass

Other findings

No free pelvic fluid.  No adnexal masses.
IMPRESSION: 3.2 cm diameter submucosal leiomyoma at anterior upper uterus.

Remainder of exam unremarkable.

## 2021-01-11 ENCOUNTER — Emergency Department (HOSPITAL_BASED_OUTPATIENT_CLINIC_OR_DEPARTMENT_OTHER): Payer: BLUE CROSS/BLUE SHIELD

## 2021-01-11 ENCOUNTER — Emergency Department (HOSPITAL_BASED_OUTPATIENT_CLINIC_OR_DEPARTMENT_OTHER)
Admission: EM | Admit: 2021-01-11 | Discharge: 2021-01-11 | Disposition: A | Discharge status: Home / Self Care | Payer: BLUE CROSS/BLUE SHIELD | Attending: Emergency Medicine | Admitting: Emergency Medicine

## 2021-01-11 ENCOUNTER — Encounter (HOSPITAL_BASED_OUTPATIENT_CLINIC_OR_DEPARTMENT_OTHER): Payer: Self-pay

## 2021-01-11 ENCOUNTER — Other Ambulatory Visit (HOSPITAL_BASED_OUTPATIENT_CLINIC_OR_DEPARTMENT_OTHER): Payer: Self-pay

## 2021-01-11 ENCOUNTER — Other Ambulatory Visit: Payer: Self-pay

## 2021-01-11 DIAGNOSIS — Z7901 Long term (current) use of anticoagulants: Secondary | ICD-10-CM | POA: Insufficient documentation

## 2021-01-11 DIAGNOSIS — J45909 Unspecified asthma, uncomplicated: Secondary | ICD-10-CM | POA: Diagnosis not present

## 2021-01-11 DIAGNOSIS — M25562 Pain in left knee: Secondary | ICD-10-CM | POA: Insufficient documentation

## 2021-01-11 MED ORDER — ONDANSETRON 4 MG PO TBDP
4.0000 mg | ORAL_TABLET | Freq: Once | ORAL | Status: AC
  Administered 2021-01-11: 4 mg via ORAL
  Filled 2021-01-11: qty 1

## 2021-01-11 MED ORDER — OXYCODONE-ACETAMINOPHEN 5-325 MG PO TABS
1.0000 | ORAL_TABLET | Freq: Four times a day (QID) | ORAL | 0 refills | Status: AC | PRN
  Filled 2021-01-11: qty 10, 3d supply, fill #0

## 2021-01-11 MED ORDER — OXYCODONE-ACETAMINOPHEN 5-325 MG PO TABS
1.0000 | ORAL_TABLET | Freq: Once | ORAL | Status: AC
  Administered 2021-01-11: 1 via ORAL
  Filled 2021-01-11: qty 1

## 2021-01-11 NOTE — ED Provider Notes (Signed)
[facility] Provider Note   CSN: [id number] Arrival date & time: 01/11/21  6063     History Chief Complaint  Patient presents with   Knee Pain    Christine Spencer is a 50 y.o. female.  Patient presents with complaint of left knee pain.  Describes it as sharp and aching localized to the left knee medial aspect.  Ongoing for about a week.  Denies any specific fall or trauma.  She states 1 day she just as she was walking she noticed sharp pain and has been persistent since then.  Denies reports of fevers or cough no vomiting or diarrhea.      Past Medical History:  Diagnosis Date   Asthma    Bronchitis    Morbid obesity ([place])     Patient Active Problem List   Diagnosis Date Noted   History of DVT in adulthood 07/04/2019   Class 3 severe obesity due to excess calories without serious comorbidity with body mass index (BMI) of 45.0 to 49.9 in adult [facility]) 07/01/2019   Menorrhagia with regular cycle 05/14/2019   SOB (shortness of breath) 05/02/2019   COVID-19 virus infection 05/02/2019   Hypokalemia 05/02/2019   Anemia 05/02/2019   Asthma    Acute on chronic anemia 05/01/2019    Past Surgical History:  Procedure Laterality Date   BREAST REDUCTION SURGERY     GANGLION CYST EXCISION       OB History     Gravida  0   Para  0   Term  0   Preterm  0   AB  0   Living  0      SAB  0   IAB  0   Ectopic  0   Multiple  0   Live Births  0           Family History  Problem Relation Age of Onset   Cancer Maternal Grandmother    Stroke Father    Diabetes Other     Social History   Tobacco Use   Smoking status: Never   Smokeless tobacco: Never  Vaping Use   Vaping Use: Never used  Substance Use Topics   Alcohol use: No   Drug use: No    Home Medications Prior to Admission medications   Medication Sig Start Date End Date Taking? Authorizing Provider  oxyCODONE-acetaminophen (PERCOCET/ROXICET) 5-325 MG tablet Take  1 tablet by mouth every 6 (six) hours as needed for up to 10 doses for severe pain. 01/11/21  Yes Luna Fuse, MD  albuterol (VENTOLIN HFA) 108 (90 Base) MCG/ACT inhaler Inhale 2 puffs into the lungs every 4 (four) hours as needed for wheezing. 04/30/16   [provider]  apixaban (ELIQUIS) 5 MG TABS tablet Take 1 tablet (5 mg total) by mouth 2 (two) times daily. 07/03/19   Ladell Pier, MD  ferrous sulfate 325 (65 FE) MG EC tablet Take 1 tablet (325 mg total) by mouth 3 (three) times daily with meals. take with stool softener/laxative 05/04/19   Patrecia Pour, MD  megestrol (MEGACE) 40 MG tablet Take 1 tablet (40 mg total) by mouth daily. Can increase to two tablets twice a day in the event of heavy bleeding 07/04/19   Lavonia Drafts, MD  Multiple Vitamin (MULTIVITAMIN) tablet Take 1 tablet by mouth daily.    [provider]    Allergies    Penicillins  Review of Systems   Review of Systems  Constitutional:  Negative for fever.  HENT:  Negative for ear pain.   Eyes:  Negative for pain.  Respiratory:  Negative for cough.   Cardiovascular:  Negative for chest pain.  Gastrointestinal:  Negative for abdominal pain.  Genitourinary:  Negative for flank pain.  Musculoskeletal:  Negative for back pain.  Skin:  Negative for rash.  Neurological:  Negative for headaches.   Physical Exam Updated Vital Signs BP (!) 142/93 (BP Location: Right Arm)   Pulse 87   Temp 98.8 F (37.1 C) (Oral)   Resp 20   Ht 5\' 1"  (1.549 m)   Wt 117.9 kg   LMP 12/01/2020 (Approximate)   SpO2 99%   BMI 49.13 kg/m   Physical Exam Constitutional:      General: She is not in acute distress.    Appearance: Normal appearance.  HENT:     Head: Normocephalic.     Nose: Nose normal.  Eyes:     Extraocular Movements: Extraocular movements intact.  Cardiovascular:     Rate and Rhythm: Normal rate.  Pulmonary:     Effort: Pulmonary effort is normal.  Musculoskeletal:        General:  Normal range of motion.     Cervical back: Normal range of motion.     Comments: Moderate tenderness to the left knee medial aspect.  Moderate swelling noted otherwise compartments are soft.  Neurovascularly intact.  No abnormal warmth noted.  No varus or valgus laxity or anterior posterior laxity noted.  Neurological:     General: No focal deficit present.     Mental Status: She is alert. Mental status is at baseline.    ED Results / Procedures / Treatments   Labs (all labs ordered are listed, but only abnormal results are displayed) Labs Reviewed - No data to display  EKG None  Radiology DG Knee Complete 4 Views Left  Result Date: 01/11/2021 CLINICAL DATA:  Left knee pain and swelling for the past week. EXAM: LEFT KNEE - COMPLETE 4+ VIEW COMPARISON:  None. FINDINGS: Moderate sized knee joint effusion. No fracture or dislocation. No evidence of lipohemarthrosis. Mild tricompartmental degenerative change in the knee, worse within the medial compartment with joint space loss, subchondral sclerosis and osteophytosis. No evidence of chondrocalcinosis. Regional soft tissues appear normal. IMPRESSION: 1. Moderate size knee joint effusion without associated fracture. 2. 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 Electronically Signed   By: Sandi Mariscal M.D.   On: 01/11/2021 10:40    Procedures Procedures   Medications Ordered in ED Medications  oxyCODONE-acetaminophen (PERCOCET/ROXICET) 5-325 MG per tablet 1 tablet (has no administration in time range)  ondansetron (ZOFRAN-ODT) disintegrating tablet 4 mg (has no administration in time range)    ED Course  I have reviewed the triage vital signs and the nursing notes.  Pertinent labs & imaging results that were available during my care of the patient were reviewed by me and considered in my medical decision making (see chart for details).    MDM Rules/Calculators/A&P                           X-rays show moderate effusion  and tricompartmental arthritic changes.  Patient given pain medication and knee immobilizer.  Advised outpatient follow-up with orthopedic surgery.  Recommend return for worsening pain fevers or additional concerns.  No clinical evidence of DVT on exam, peripheral perfusion appears normal.  Final Clinical Impression(s) / ED Diagnoses Final diagnoses:  Acute  pain of left knee    Rx / DC Orders ED Discharge Orders          Ordered    oxyCODONE-acetaminophen (PERCOCET/ROXICET) 5-325 MG tablet  Every 6 hours PRN        01/11/21 1107             Luna Fuse, MD 01/11/21 1107

## 2021-01-11 NOTE — ED Triage Notes (Signed)
Pt c/o left knee pain with swelling x 1 week. States "feels like its going to break" when she moves certain ways. Denies known injury

## 2021-01-11 NOTE — Discharge Instructions (Addendum)
Call your primary care doctor or specialist as discussed in the next 2-3 days.   Return immediately back to the ER if:  Your symptoms worsen within the next 12-24 hours. You develop new symptoms such as new fevers, persistent vomiting, new pain, shortness of breath, or new weakness or numbness, or if you have any other concerns.  

## 2022-07-09 IMAGING — CR DG KNEE COMPLETE 4+V*L*
4 series · 4 of 4 positions shown · non-contrast
Comparison: None.

CLINICAL DATA: Left knee pain and swelling for the past week.

EXAM:
LEFT KNEE - COMPLETE 4+ VIEW

[t knee ap left]
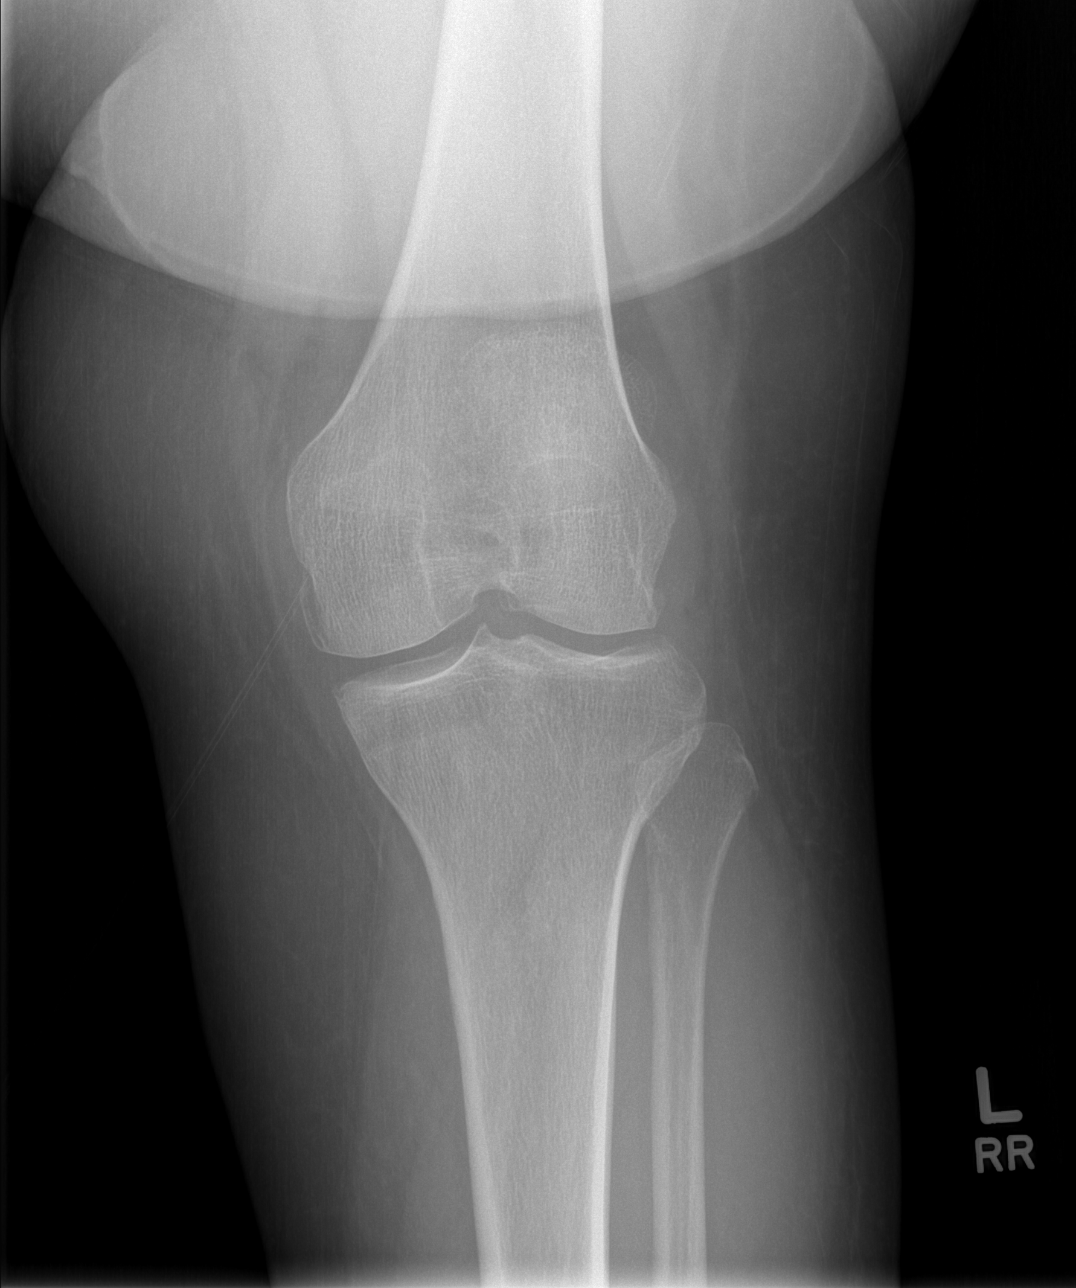

[t knee oblique left (1 of 2)]
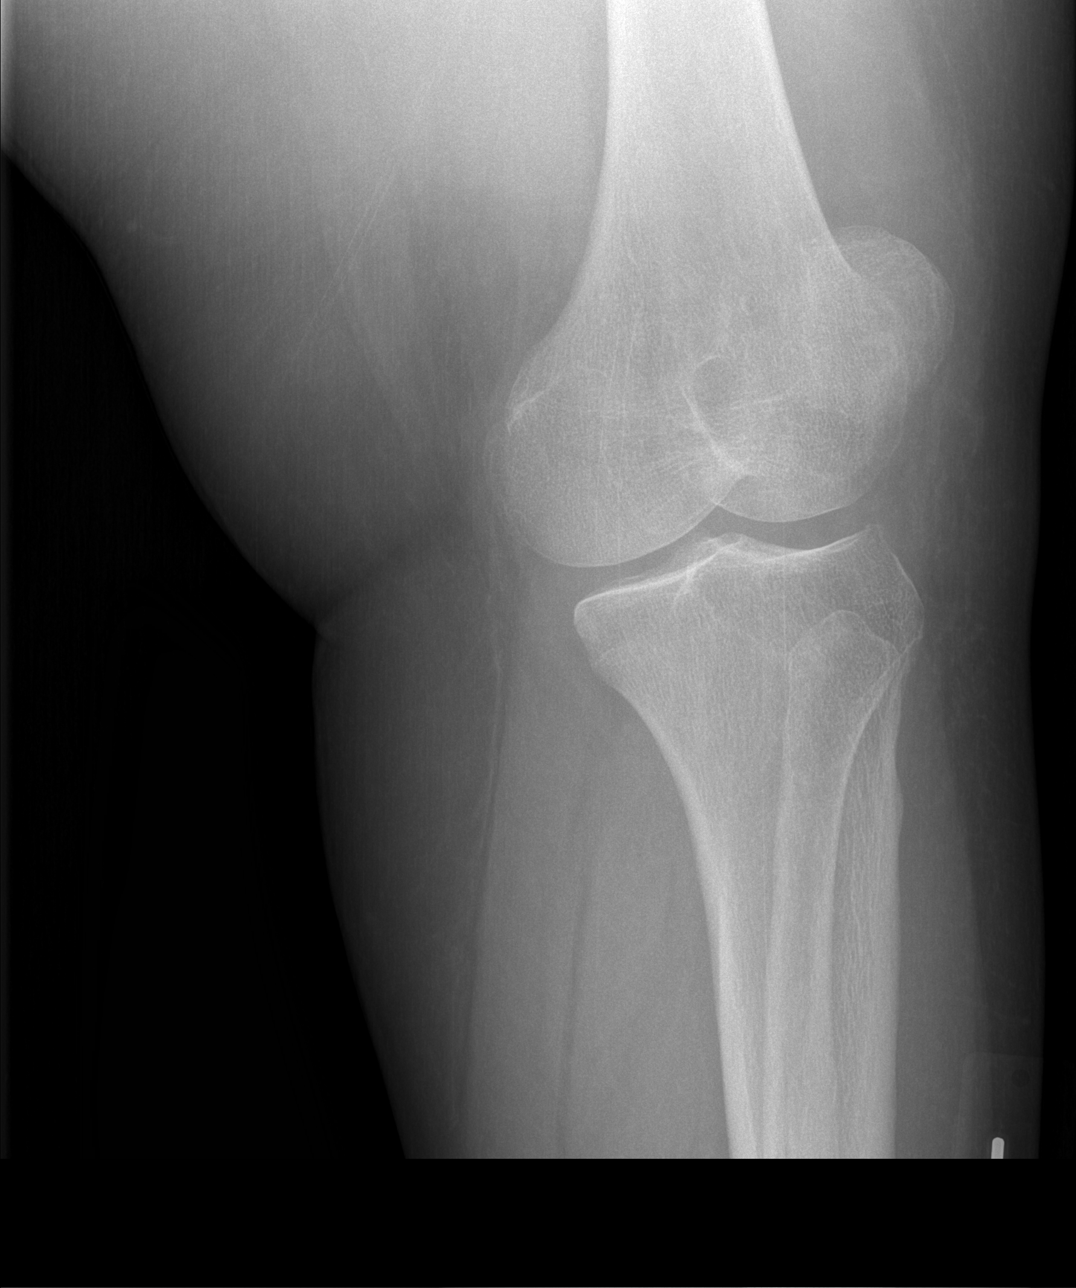

[t knee oblique left (2 of 2)]
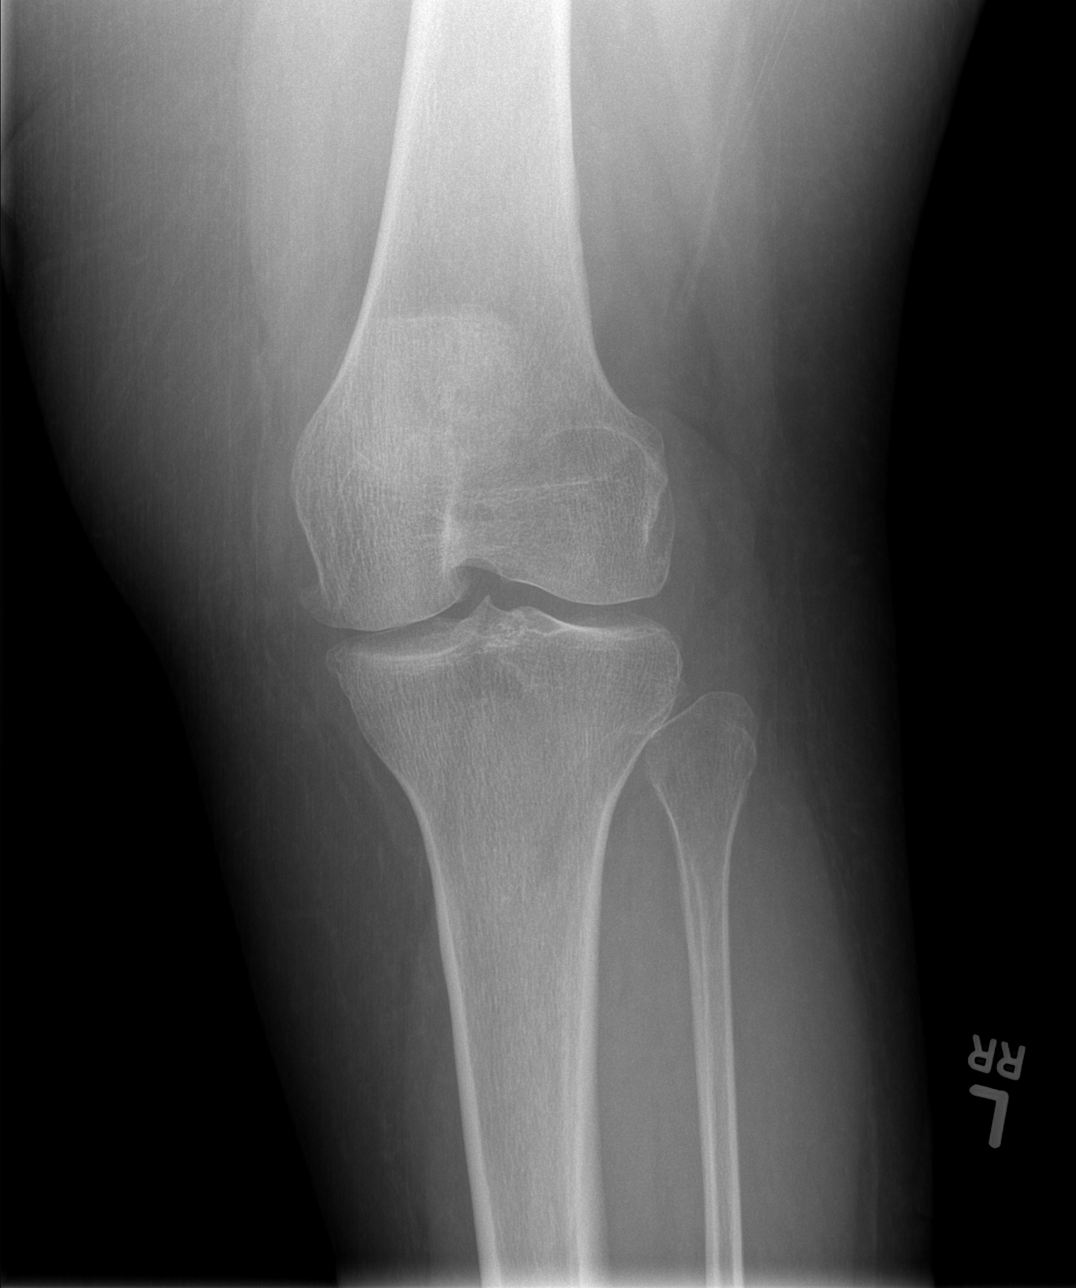

[t knee lat left]
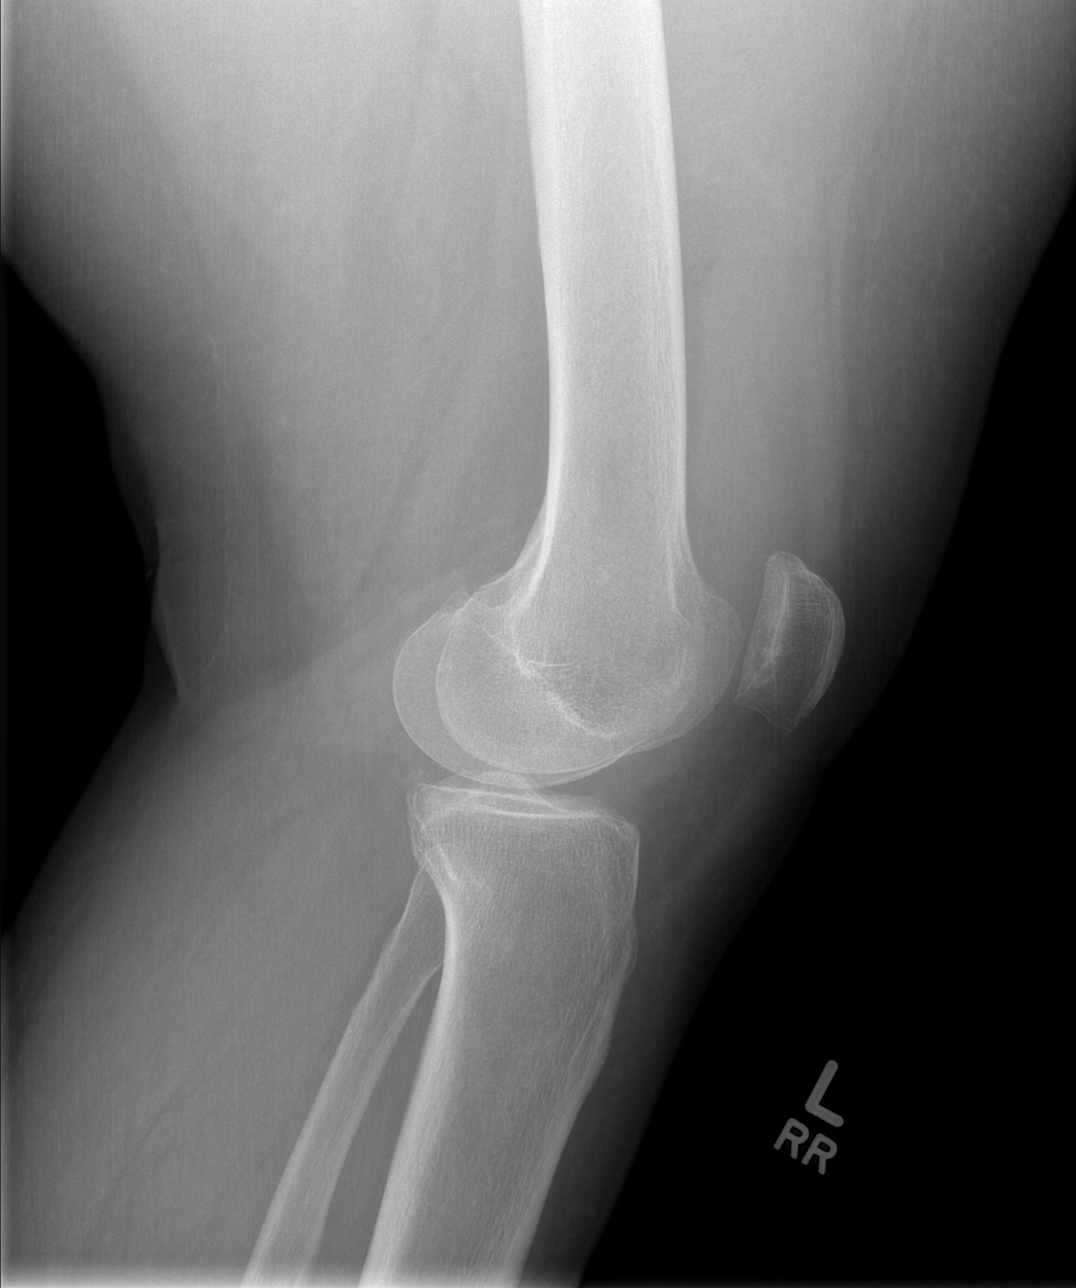

[4 of 4 positions shown; findings below may reference images not displayed]

FINDINGS: Moderate sized knee joint effusion. No fracture or dislocation. No
evidence of lipohemarthrosis. Mild tricompartmental degenerative
change in the knee, worse within the medial compartment with joint
space loss, subchondral sclerosis and osteophytosis. No evidence of
chondrocalcinosis. Regional soft tissues appear normal.
IMPRESSION: 1. Moderate size knee joint effusion without associated fracture.
2. Mild tricompartmental degenerative change of the knee.

## 2022-07-13 ENCOUNTER — Encounter: Payer: Self-pay | Admitting: *Deleted
